# Patient Record
Sex: Male | Born: 1970 | Race: White | Hispanic: No | Marital: Married | Smoking: Former smoker
Health system: Southern US, Community
[De-identification: ages and names within clinical notes are randomized; demographics above are authoritative.]

## PROBLEM LIST (undated history)

## (undated) DIAGNOSIS — I10 Essential (primary) hypertension: Secondary | ICD-10-CM

## (undated) DIAGNOSIS — IMO0002 Reserved for concepts with insufficient information to code with codable children: Secondary | ICD-10-CM

## (undated) DIAGNOSIS — K219 Gastro-esophageal reflux disease without esophagitis: Secondary | ICD-10-CM

## (undated) DIAGNOSIS — N200 Calculus of kidney: Secondary | ICD-10-CM

## (undated) HISTORY — PX: ELBOW SURGERY: SHX618

## (undated) HISTORY — DX: Reserved for concepts with insufficient information to code with codable children: IMO0002

## (undated) HISTORY — PX: INGUINAL HERNIA REPAIR: SUR1180

## (undated) HISTORY — PX: TONSILLECTOMY: SUR1361

## (undated) HISTORY — DX: Calculus of kidney: N20.0

## (undated) HISTORY — DX: Gastro-esophageal reflux disease without esophagitis: K21.9

## (undated) HISTORY — DX: Essential (primary) hypertension: I10

---

## 1999-11-10 ENCOUNTER — Emergency Department (HOSPITAL_COMMUNITY): Admission: EM | Admit: 1999-11-10 | Discharge: 1999-11-10 | Payer: Self-pay | Admitting: Emergency Medicine

## 2002-08-14 ENCOUNTER — Ambulatory Visit (HOSPITAL_BASED_OUTPATIENT_CLINIC_OR_DEPARTMENT_OTHER): Admission: RE | Admit: 2002-08-14 | Discharge: 2002-08-14 | Payer: Self-pay | Admitting: Orthopedic Surgery

## 2004-09-11 ENCOUNTER — Emergency Department: Payer: Self-pay | Admitting: Emergency Medicine

## 2008-06-27 ENCOUNTER — Ambulatory Visit: Payer: Self-pay | Admitting: Urology

## 2008-07-15 ENCOUNTER — Ambulatory Visit: Payer: Self-pay | Admitting: Urology

## 2011-01-05 ENCOUNTER — Ambulatory Visit: Payer: Self-pay | Admitting: Emergency Medicine

## 2013-02-19 ENCOUNTER — Ambulatory Visit: Payer: Self-pay | Admitting: Internal Medicine

## 2013-04-18 ENCOUNTER — Ambulatory Visit (INDEPENDENT_AMBULATORY_CARE_PROVIDER_SITE_OTHER): Payer: BC Managed Care – HMO | Admitting: Internal Medicine

## 2013-04-18 ENCOUNTER — Encounter: Payer: Self-pay | Admitting: Internal Medicine

## 2013-04-18 VITALS — BP 120/90 | HR 63 | Temp 98.7°F | Ht 69.5 in | Wt 197.0 lb

## 2013-04-18 DIAGNOSIS — M549 Dorsalgia, unspecified: Secondary | ICD-10-CM

## 2013-04-18 DIAGNOSIS — E7800 Pure hypercholesterolemia, unspecified: Secondary | ICD-10-CM

## 2013-04-18 DIAGNOSIS — R5383 Other fatigue: Secondary | ICD-10-CM

## 2013-04-18 DIAGNOSIS — R05 Cough: Secondary | ICD-10-CM

## 2013-04-18 DIAGNOSIS — R5381 Other malaise: Secondary | ICD-10-CM

## 2013-04-18 DIAGNOSIS — R059 Cough, unspecified: Secondary | ICD-10-CM

## 2013-04-18 DIAGNOSIS — I10 Essential (primary) hypertension: Secondary | ICD-10-CM

## 2013-04-18 DIAGNOSIS — N200 Calculus of kidney: Secondary | ICD-10-CM

## 2013-04-18 LAB — COMPREHENSIVE METABOLIC PANEL
ALT: 22 U/L (ref 0–53)
AST: 18 U/L (ref 0–37)
Albumin: 4.8 g/dL (ref 3.5–5.2)
BUN: 10 mg/dL (ref 6–23)
Calcium: 9.9 mg/dL (ref 8.4–10.5)
Chloride: 103 mEq/L (ref 96–112)
Potassium: 4.3 mEq/L (ref 3.5–5.1)

## 2013-04-18 LAB — CBC WITH DIFFERENTIAL/PLATELET
Basophils Absolute: 0 10*3/uL (ref 0.0–0.1)
Basophils Relative: 0.4 % (ref 0.0–3.0)
Eosinophils Absolute: 0 10*3/uL (ref 0.0–0.7)
Lymphocytes Relative: 19.6 % (ref 12.0–46.0)
MCHC: 33.6 g/dL (ref 30.0–36.0)
Neutrophils Relative %: 71.5 % (ref 43.0–77.0)
RBC: 5.2 Mil/uL (ref 4.22–5.81)
WBC: 6 10*3/uL (ref 4.5–10.5)

## 2013-04-18 LAB — LIPID PANEL
LDL Cholesterol: 133 mg/dL — ABNORMAL HIGH (ref 0–99)
Total CHOL/HDL Ratio: 6

## 2013-04-18 MED ORDER — AMLODIPINE BESYLATE 5 MG PO TABS: 5.0000 mg | ORAL_TABLET | Freq: Every day | ORAL | Status: DC

## 2013-04-22 ENCOUNTER — Encounter: Payer: Self-pay | Admitting: Internal Medicine

## 2013-04-22 DIAGNOSIS — M549 Dorsalgia, unspecified: Secondary | ICD-10-CM | POA: Insufficient documentation

## 2013-04-22 DIAGNOSIS — R059 Cough, unspecified: Secondary | ICD-10-CM | POA: Insufficient documentation

## 2013-04-22 DIAGNOSIS — I10 Essential (primary) hypertension: Secondary | ICD-10-CM | POA: Insufficient documentation

## 2013-04-22 DIAGNOSIS — R05 Cough: Secondary | ICD-10-CM | POA: Insufficient documentation

## 2013-04-22 DIAGNOSIS — N200 Calculus of kidney: Secondary | ICD-10-CM | POA: Insufficient documentation

## 2013-04-22 NOTE — Assessment & Plan Note (Signed)
Has a history of bulging disc.  Previous MRI.  Worked up by [facility].  No pain now.  Follow.

## 2013-04-22 NOTE — Assessment & Plan Note (Signed)
Worked up by [facility].  Obtain records.  Currently asymptomatic.  Follow.

## 2013-04-22 NOTE — Assessment & Plan Note (Signed)
Feel may be related to acid reflux.  Start prilosec daily.  Follow. Get her back in soon to reassess.

## 2013-04-22 NOTE — Assessment & Plan Note (Signed)
Blood pressure doing well on amlodipine.  Check metabolic panel.  Follow.

## 2013-04-22 NOTE — Progress Notes (Signed)
  Subjective:    Patient ID: Fernando Harris, male    DOB: 1971/08/17, 42 y.o.   MRN: [id number]  HPI 42 year old male with past history of hypertension and kidney stones who comes in today to follow up on these issues as well as to establish care.  Previously has seen Dr Terance Hart.  Recently evaluated at [facility].  Blood pressure elevated.  Was placed on amlodipine.  Taking over the last few months.  Last physical was five years ago.  Was told he had a slightly elevated cholesterol previously.  Stays active.  States has a history of kidney stones.  States was worked up at [facility].  Was told he had prostate infection.  He did on one occasion pass a part of a stone.  Last episode four years ago.  No abdominal pain in a while.  Some cough in the am.  Hacking cough.  Discussed acid reflux.  Does have some issues with acid reflux.  Has quit smoking.  Dipped snuff until one year ago.  Now not dipping snuff or smoking.  Off nicotine patches x three months.     Past Medical History  Diagnosis Date  . Hypertension   . Nephrolithiasis   . Bulging disc     evaluated [facility]   . Outpatient Encounter Prescriptions as of 04/18/2013  Medication Sig Dispense Refill  . amLODipine (NORVASC) 5 MG tablet Take 1 tablet (5 mg total) by mouth daily.  30 tablet  3  . cephALEXin (KEFLEX) 500 MG capsule Take 500 mg by mouth 2 (two) times daily.      Marland Kitchen omeprazole (PRILOSEC) 20 MG capsule Take 20 mg by mouth daily as needed.      . [DISCONTINUED] amLODipine (NORVASC) 5 MG tablet Take 5 mg by mouth daily.       No facility-administered encounter medications on file as of 04/18/2013.    Review of Systems Patient denies any headache, lightheadedness or dizziness.  No sinus or allergy symptoms.  No chest pain, tightness or palpitations.  No increased shortness of breath or congestion.  Does report a dry cough.  No nausea or vomiting. Some acid reflux.   No abdominal pain or cramping.  No bowel  change, such as diarrhea, constipation, BRBPR or melana.  No urine change.  Occasionally will get a little shaky if has not eaten.  Taking amlodipine.  Tolerating.        Objective:   Physical Exam Filed Vitals:   04/18/13 1004  BP: 120/90  Pulse: 63  Temp: 98.7 F (37.1 C)   Blood pressure recheck:  14/46  42 year old male in no acute distress.   HEENT:  Nares- clear.  Oropharynx - without lesions. NECK:  Supple.  Nontender.  No audible bruit.  HEART:  Appears to be regular. LUNGS:  No crackles or wheezing audible.  Respirations even and unlabored.  RADIAL PULSE:  Equal bilaterally. ABDOMEN:  Soft, nontender.  Bowel sounds present and normal.  No audible abdominal bruit   EXTREMITIES:  No increased edema present.  DP pulses palpable and equal bilaterally.      Assessment & Plan:  INCREASED PSYCHOSOCIAL STRESSORS.  Going through a separation.  Feels he is handling things relatively well.  Going to a Veterinary surgeon.  Follow.   HEALTH MAINTENANCE.  Schedule him for a physical - next visit.

## 2013-04-23 ENCOUNTER — Encounter: Payer: Self-pay | Admitting: Internal Medicine

## 2013-05-23 ENCOUNTER — Encounter: Payer: Self-pay | Admitting: Internal Medicine

## 2013-06-29 ENCOUNTER — Encounter: Payer: BC Managed Care – PPO | Admitting: Internal Medicine

## 2013-07-06 ENCOUNTER — Encounter: Payer: Self-pay | Admitting: Internal Medicine

## 2013-07-06 ENCOUNTER — Ambulatory Visit (INDEPENDENT_AMBULATORY_CARE_PROVIDER_SITE_OTHER): Payer: BC Managed Care – PPO | Admitting: Internal Medicine

## 2013-07-06 VITALS — BP 128/80 | HR 72 | Temp 98.6°F | Ht 69.0 in | Wt 195.5 lb

## 2013-07-06 DIAGNOSIS — R059 Cough, unspecified: Secondary | ICD-10-CM

## 2013-07-06 DIAGNOSIS — N200 Calculus of kidney: Secondary | ICD-10-CM

## 2013-07-06 DIAGNOSIS — R21 Rash and other nonspecific skin eruption: Secondary | ICD-10-CM

## 2013-07-06 DIAGNOSIS — I10 Essential (primary) hypertension: Secondary | ICD-10-CM

## 2013-07-06 DIAGNOSIS — L989 Disorder of the skin and subcutaneous tissue, unspecified: Secondary | ICD-10-CM

## 2013-07-06 DIAGNOSIS — M549 Dorsalgia, unspecified: Secondary | ICD-10-CM

## 2013-07-06 DIAGNOSIS — R05 Cough: Secondary | ICD-10-CM

## 2013-07-06 MED ORDER — SERTRALINE HCL 50 MG PO TABS: 50.0000 mg | ORAL_TABLET | Freq: Every day | ORAL | Status: DC

## 2013-07-06 MED ORDER — AMLODIPINE BESYLATE 5 MG PO TABS: 5.0000 mg | ORAL_TABLET | Freq: Every day | ORAL | Status: DC

## 2013-07-09 ENCOUNTER — Encounter: Payer: Self-pay | Admitting: Internal Medicine

## 2013-07-09 DIAGNOSIS — R21 Rash and other nonspecific skin eruption: Secondary | ICD-10-CM | POA: Insufficient documentation

## 2013-07-09 NOTE — Assessment & Plan Note (Signed)
Blood pressure doing well on amlodipine.  Follow metabolic panel.  Follow pressures.

## 2013-07-09 NOTE — Assessment & Plan Note (Signed)
Has a history of bulging disc.  Previous MRI.  Worked up by [facility].  No pain now.  Follow.   

## 2013-07-09 NOTE — Assessment & Plan Note (Signed)
Still feel may be related to acid reflux.  Never took the prilosec on a regular basis.  Have him take regularly.  Get him back in soon to reassess.

## 2013-07-09 NOTE — Progress Notes (Signed)
Subjective:    Patient ID: Fernando Harris, male    DOB: 06-04-1971, 42 y.o.   MRN: [id number]  HPI 42 year old male with past history of hypertension and kidney stones who comes in today to follow up on these issues as well as for a complete physical exam.  Stays active.  States has a history of kidney stones.  States was worked up at [facility].  Was told he had prostate infection.  He did on one occasion pass a part of a stone.  Last episode four years ago.  No abdominal pain in a while.  Some cough in the am.  Hacking cough.  Discussed acid reflux last visit.  Placed him on prilosec.  Has not been taking regularly.  Had quit smoking.  Back to smoking again.  Plans to stop.   Increased stress with his separation.  Still seeing a therapist - weekly.  Is now in a new relationship.  Feels better about this.  Increased anxiety.  Feels he needs something to help control this.  Reports he will have episodes of increased heart rate and some chest tightness.  Feels similar to his previous anxiety attacks.  Has no chest pain or tightness with increased activity or exertion.      Past Medical History  Diagnosis Date  . Hypertension   . Nephrolithiasis   . Bulging disc     evaluated  [facility]   . Outpatient Encounter Prescriptions as of 07/06/2013  Medication Sig Dispense Refill  . amLODipine (NORVASC) 5 MG tablet Take 1 tablet (5 mg total) by mouth daily.  90 tablet  1  . omeprazole (PRILOSEC) 20 MG capsule Take 20 mg by mouth daily as needed.      . [DISCONTINUED] amLODipine (NORVASC) 5 MG tablet Take 1 tablet (5 mg total) by mouth daily.  30 tablet  3  . sertraline (ZOLOFT) 50 MG tablet Take 1 tablet (50 mg total) by mouth daily.  30 tablet  2  . [DISCONTINUED] cephALEXin (KEFLEX) 500 MG capsule Take 500 mg by mouth 2 (two) times daily.       No facility-administered encounter medications on file as of 07/06/2013.    Review of Systems Patient denies any headache,  lightheadedness or dizziness.  No sinus or allergy symptoms.  No chest pain, tightness or palpitations with increased activity or exertion.  No increased shortness of breath or congestion.  Does report a dry cough.  Persistent.  Did not feel the prilosec helped, but did no take regularly.  No nausea or vomiting.  No abdominal pain or cramping.  No bowel change, such as diarrhea, constipation, BRBPR or melana.  No urine change.  Blood pressure doing well.  Tolerating norvasc.  Persistent skin rash - left lower leg.  Increased stress and anxiety as outlined. Feels similar to previous anxiety attacks.  Feels he needs something to help control.  No significant depression.  Seeing his therapist weekly.       Objective:   Physical Exam  Filed Vitals:   07/06/13 1503  BP: 128/80  Pulse: 72  Temp: 98.6 F (76 C)   42 year old male in no acute distress.  HEENT:  Nares - clear.  Oropharynx - without lesions. NECK:  Supple.  Nontender.  No audible carotid bruit.  HEART:  Appears to be regular.   LUNGS:  No crackles or wheezing audible.  Respirations even and unlabored.   RADIAL PULSE:  Equal bilaterally.  ABDOMEN:  Soft.  Nontender.  Bowel sounds present and normal.  No audible abdominal bruit.  GU:  Normal descended testicles.  No palpable testicular nodules.   RECTAL:  Could not appreciate any palpable prostate nodules.  Heme negative.   EXTREMITIES:  No increased edema present.  DP pulses palpable and equal bilaterally.         Assessment & Plan:  INCREASED PSYCHOSOCIAL STRESSORS.  Going through a separation.  Feels he is handling things relatively well.  Going to a Veterinary surgeon.  In a new relationship now.  Some anxiety as outlined.  Start zoloft as directed.  Get him back in soon to reassess.   HEALTH MAINTENANCE.  Physical today.  Check psa.

## 2013-07-09 NOTE — Assessment & Plan Note (Addendum)
Worked up by [facility].  Currently asymptomatic.  Follow.   

## 2013-07-09 NOTE — Assessment & Plan Note (Signed)
Persistent rash left lower leg.  Refer to dermatology.  He wants a skin survey as well.

## 2013-07-16 ENCOUNTER — Encounter: Payer: Self-pay | Admitting: Emergency Medicine

## 2013-08-22 ENCOUNTER — Encounter: Payer: Self-pay | Admitting: *Deleted

## 2013-08-23 ENCOUNTER — Ambulatory Visit (INDEPENDENT_AMBULATORY_CARE_PROVIDER_SITE_OTHER): Payer: BC Managed Care – PPO | Admitting: Internal Medicine

## 2013-08-23 ENCOUNTER — Ambulatory Visit (INDEPENDENT_AMBULATORY_CARE_PROVIDER_SITE_OTHER)
Admission: RE | Admit: 2013-08-23 | Discharge: 2013-08-23 | Disposition: A | Discharge status: Home / Self Care | Payer: BC Managed Care – PPO | Source: Ambulatory Visit | Attending: Internal Medicine | Admitting: Internal Medicine

## 2013-08-23 ENCOUNTER — Encounter: Payer: Self-pay | Admitting: Internal Medicine

## 2013-08-23 VITALS — BP 150/100 | HR 77 | Temp 98.9°F | Ht 69.0 in | Wt 198.0 lb

## 2013-08-23 DIAGNOSIS — R21 Rash and other nonspecific skin eruption: Secondary | ICD-10-CM

## 2013-08-23 DIAGNOSIS — I10 Essential (primary) hypertension: Secondary | ICD-10-CM

## 2013-08-23 DIAGNOSIS — N200 Calculus of kidney: Secondary | ICD-10-CM

## 2013-08-23 DIAGNOSIS — Z23 Encounter for immunization: Secondary | ICD-10-CM

## 2013-08-23 DIAGNOSIS — S40812A Abrasion of left upper arm, initial encounter: Secondary | ICD-10-CM

## 2013-08-23 DIAGNOSIS — IMO0002 Reserved for concepts with insufficient information to code with codable children: Secondary | ICD-10-CM

## 2013-08-23 DIAGNOSIS — R05 Cough: Secondary | ICD-10-CM

## 2013-08-23 DIAGNOSIS — M549 Dorsalgia, unspecified: Secondary | ICD-10-CM

## 2013-08-23 DIAGNOSIS — R059 Cough, unspecified: Secondary | ICD-10-CM

## 2013-08-23 MED ORDER — CLONAZEPAM 0.5 MG PO TABS: 0.5000 mg | ORAL_TABLET | Freq: Every evening | ORAL | Status: DC | PRN

## 2013-08-23 NOTE — Assessment & Plan Note (Signed)
Persistent rash left lower leg.  Refer to dermatology.  He wants a skin survey as well.

## 2013-08-23 NOTE — Progress Notes (Signed)
Subjective:    Patient ID: Fernando Harris, male    DOB: 03-Aug-1971, 42 y.o.   MRN: [id number]  HPI 42 year old male with past history of hypertension and kidney stones who comes in today for a scheduled follow up.  Stays active.  States has a history of kidney stones.  States was worked up at [facility].  Was told he had prostate infection.  He did on one occasion pass a part of a stone.  Last episode four years ago.  No abdominal pain in a while.  Some cough in the am.  Hacking cough.  Persistent.  Discussed acid reflux previously.  Placed him on prilosec.  Still has not been taking regularly.  Had quit smoking.  Back to smoking again.  Plans to stop.   Increased stress with his separation.  Still seeing a therapist.  is now in a new relationship.  Feels better about this.  Increased anxiety.  Feels he needs something to help control this.  Reports he will have episodes of increased heart rate and some chest tightness.  Feels similar to his previous anxiety attacks.  Has no chest pain or tightness with increased activity or exertion.  States he has taken clonazepam previously and would like to try this again.  I tried him on zoloft and this caused increased sweats and erectile dysfunction.   He tapered off.  States his blood pressure checks have been ok.  Feels increased today secondary to being anxious.  He was working yesterday and slid down a tree - injuring his arms.  Left arm worse than right.      Past Medical History  Diagnosis Date  . Hypertension   . Nephrolithiasis   . Bulging disc     evaluated [facility]   . Outpatient Encounter Prescriptions as of 08/23/2013  Medication Sig Dispense Refill  . amLODipine (NORVASC) 5 MG tablet Take 1 tablet (5 mg total) by mouth daily.  90 tablet  1  . omeprazole (PRILOSEC) 20 MG capsule Take 20 mg by mouth daily as needed.      . [DISCONTINUED] sertraline (ZOLOFT) 50 MG tablet Take 1 tablet (50 mg total) by mouth daily.  30 tablet   2   No facility-administered encounter medications on file as of 08/23/2013.    Review of Systems Patient denies any headache, lightheadedness or dizziness.  No sinus or allergy symptoms.  No chest pain, tightness or palpitations with increased activity or exertion.  No increased shortness of breath or congestion.  Does report a dry cough.  Persistent.  Did not feel the prilosec helped, but still has not taken regularly.  No nausea or vomiting.  No abdominal pain or cramping.  No bowel change, such as diarrhea, constipation, BRBPR or melana.  No urine change.  Blood pressure doing well.  Tolerating norvasc.   Increased stress and anxiety as outlined. Feels similar to previous anxiety attacks.  Feels he needs something to help control.  No significant depression.  Seeing his therapist.  Has tried clonazepam previously and would like to try this again.  Did not tolerate zoloft.  Injured his left arm yesterday.        Objective:   Physical Exam  Filed Vitals:   08/23/13 0817  BP: 150/100  Pulse: 77  Temp: 98.9 F (37.2 C)   Blood pressure recheck:  75/54  42 year old male in no acute distress.  HEENT:  Nares - clear.  Oropharynx - without lesions. NECK:  Supple.  Nontender.  No audible carotid bruit.  HEART:  Appears to be regular.   LUNGS:  No crackles or wheezing audible.  Respirations even and unlabored.   RADIAL PULSE:  Equal bilaterally.  ABDOMEN:  Soft.  Nontender.  Bowel sounds present and normal.  No audible abdominal bruit.  EXTREMITIES:  No increased edema present.  DP pulses palpable and equal bilaterally.  SKIN:  Left arm with skin abrasions upper left arm and lower forearm.          Assessment & Plan:  INCREASED PSYCHOSOCIAL STRESSORS.  Going through a separation.  Feels he is handling things relatively well.  Going to a Veterinary surgeon.  In a new relationship now.  Some anxiety as outlined.  Did not tolerate zoloft.  Has tried clonazepam.  Tolerated.  Requested to try this  again.  Will try clonazepam as directed.  States he rarely has any alcohol.  Follow closely.  Notify me if change or problems.    HEALTH MAINTENANCE.  Physical last visit.  Check psa with next labs.

## 2013-08-23 NOTE — Assessment & Plan Note (Addendum)
Blood pressure has been doing well on amlodipine.  Elevated today.  Hold on making changes in his medication.  Follow metabolic panel.  Follow pressures.  Treat anxiety.  Get him back in soon to reassess.

## 2013-08-26 ENCOUNTER — Encounter: Payer: Self-pay | Admitting: Internal Medicine

## 2013-08-26 DIAGNOSIS — S40812A Abrasion of left upper arm, initial encounter: Secondary | ICD-10-CM | POA: Insufficient documentation

## 2013-08-26 NOTE — Assessment & Plan Note (Signed)
Still feel may be related to acid reflux.  Never took the prilosec on a regular basis.  Have him take regularly.  Get him back in soon to reassess.  Check cxr.  Lungs sound clear.  No significant allergy symptoms.

## 2013-08-26 NOTE — Assessment & Plan Note (Signed)
S/p injury yesterday.  Tetanus booster given.  bactroban cream as directed.  Follow closely.

## 2013-08-26 NOTE — Assessment & Plan Note (Signed)
Worked up by [facility].  Currently asymptomatic.  Follow.   

## 2013-08-26 NOTE — Assessment & Plan Note (Signed)
Has a history of bulging disc.  Previous MRI.  Worked up by [facility].  No pain now.  Follow.

## 2013-08-30 ENCOUNTER — Other Ambulatory Visit: Payer: Self-pay

## 2013-09-21 ENCOUNTER — Encounter: Payer: Self-pay | Admitting: Internal Medicine

## 2013-09-21 ENCOUNTER — Ambulatory Visit (INDEPENDENT_AMBULATORY_CARE_PROVIDER_SITE_OTHER): Payer: BC Managed Care – PPO | Admitting: Internal Medicine

## 2013-09-21 VITALS — BP 130/80 | HR 81 | Temp 97.9°F | Ht 69.0 in | Wt 199.0 lb

## 2013-09-21 DIAGNOSIS — S40812D Abrasion of left upper arm, subsequent encounter: Secondary | ICD-10-CM

## 2013-09-21 DIAGNOSIS — E7800 Pure hypercholesterolemia, unspecified: Secondary | ICD-10-CM

## 2013-09-21 DIAGNOSIS — R05 Cough: Secondary | ICD-10-CM

## 2013-09-21 DIAGNOSIS — R059 Cough, unspecified: Secondary | ICD-10-CM

## 2013-09-21 DIAGNOSIS — Z125 Encounter for screening for malignant neoplasm of prostate: Secondary | ICD-10-CM

## 2013-09-21 DIAGNOSIS — F411 Generalized anxiety disorder: Secondary | ICD-10-CM

## 2013-09-21 DIAGNOSIS — Z5189 Encounter for other specified aftercare: Secondary | ICD-10-CM

## 2013-09-21 DIAGNOSIS — I10 Essential (primary) hypertension: Secondary | ICD-10-CM

## 2013-09-21 DIAGNOSIS — F419 Anxiety disorder, unspecified: Secondary | ICD-10-CM

## 2013-09-21 DIAGNOSIS — N200 Calculus of kidney: Secondary | ICD-10-CM

## 2013-09-21 MED ORDER — CLONAZEPAM 0.5 MG PO TABS: 0.5000 mg | ORAL_TABLET | Freq: Every evening | ORAL | Status: DC | PRN

## 2013-09-21 NOTE — Progress Notes (Signed)
  Subjective:    Patient ID: Fernando Harris, male    DOB: 07-Mar-1971, 42 y.o.   MRN: [id number]  HPI 42 year old male with past history of hypertension and kidney stones who comes in today for a scheduled follow up.  Stays active.  States has a history of kidney stones.  States was worked up at [facility].  Was told he had prostate infection.  He did on one occasion pass a part of a stone.  Last episode four years ago.  No abdominal pain in a while.  Previously had some cough in the am.  Placed him on prilosec.  He quit smoking.  States that over the last few weeks his cough has resolved.  Increased stress with his separation.  Seeing a therapist.  Is now in a new relationship.  Feels better about this.  Was having increased anxiety.   Was started on clonazepam last visit.  Taking this - one po q hs.  This has helped.  Feeling better.  Doing better.   Has no chest pain or tightness with increased activity or exertion.   I tried him on zoloft and this caused increased sweats and erectile dysfunction.   He tapered off.  States his blood pressure checks have been ok.        Past Medical History  Diagnosis Date  . Hypertension   . Nephrolithiasis   . Bulging disc     evaluated [facility]   . Outpatient Encounter Prescriptions as of 09/21/2013  Medication Sig  . amLODipine (NORVASC) 5 MG tablet Take 1 tablet (5 mg total) by mouth daily.  . clonazePAM (KLONOPIN) 0.5 MG tablet Take 1 tablet (0.5 mg total) by mouth at bedtime as needed for anxiety.  Marland Kitchen omeprazole (PRILOSEC) 20 MG capsule Take 20 mg by mouth daily as needed.    Review of Systems Patient denies any headache, lightheadedness or dizziness.  No sinus or allergy symptoms.  No chest pain, tightness or palpitations with increased activity or exertion.  No increased shortness of breath or congestion.  The dry cough resolved.  No nausea or vomiting.  No abdominal pain or cramping.  No bowel change, such as diarrhea,  constipation, BRBPR or melana.  No urine change.  Blood pressure doing well.  Tolerating norvasc.   Increased stress and anxiety as outlined.  Doing better with the clonazepam.  Seeing his therapist.  Feels better.  Arms have healed.       Objective:   Physical Exam  Filed Vitals:   09/21/13 0801  BP: 130/80  Pulse: 81  Temp: 97.9 F (36.6 C)   Blood pressure recheck:  18/57  42 year old male in no acute distress.  HEENT:  Nares - clear.  Oropharynx - without lesions. NECK:  Supple.  Nontender.  No audible carotid bruit.  HEART:  Appears to be regular.   LUNGS:  No crackles or wheezing audible.  Respirations even and unlabored.   RADIAL PULSE:  Equal bilaterally.  ABDOMEN:  Soft.  Nontender.  Bowel sounds present and normal.  No audible abdominal bruit.  EXTREMITIES:  No increased edema present.  DP pulses palpable and equal bilaterally.  SKIN:  Arm lesions healed.        Assessment & Plan:  HEALTH MAINTENANCE.  Physical 07/06/13.   Check psa with next labs.

## 2013-09-21 NOTE — Progress Notes (Signed)
Pre-visit discussion using our clinic review tool. No additional management support is needed unless otherwise documented below in the visit note.  

## 2013-09-23 ENCOUNTER — Encounter: Payer: Self-pay | Admitting: Internal Medicine

## 2013-09-23 DIAGNOSIS — F419 Anxiety disorder, unspecified: Secondary | ICD-10-CM | POA: Insufficient documentation

## 2013-09-23 NOTE — Assessment & Plan Note (Signed)
Doing better on clonazepam.  Taking daily - at night.  Follow.  Feels better.

## 2013-09-23 NOTE — Assessment & Plan Note (Signed)
Blood pressure has been doing well on amlodipine.   Follow metabolic panel.  Follow pressures.

## 2013-09-23 NOTE — Assessment & Plan Note (Signed)
Resolved

## 2013-09-23 NOTE — Assessment & Plan Note (Signed)
Resolved with quitting smoking.  Follow.   

## 2013-09-23 NOTE — Assessment & Plan Note (Signed)
Worked up by [facility].  Currently asymptomatic.  Follow.   

## 2013-10-23 ENCOUNTER — Other Ambulatory Visit: Payer: BC Managed Care – PPO

## 2013-10-25 ENCOUNTER — Encounter: Payer: Self-pay | Admitting: Internal Medicine

## 2013-10-26 ENCOUNTER — Other Ambulatory Visit: Payer: BC Managed Care – PPO

## 2013-11-05 ENCOUNTER — Other Ambulatory Visit: Payer: Self-pay | Admitting: *Deleted

## 2013-11-05 ENCOUNTER — Other Ambulatory Visit (INDEPENDENT_AMBULATORY_CARE_PROVIDER_SITE_OTHER): Payer: BC Managed Care – PPO

## 2013-11-05 DIAGNOSIS — Z125 Encounter for screening for malignant neoplasm of prostate: Secondary | ICD-10-CM

## 2013-11-05 DIAGNOSIS — E7800 Pure hypercholesterolemia, unspecified: Secondary | ICD-10-CM

## 2013-11-05 DIAGNOSIS — I10 Essential (primary) hypertension: Secondary | ICD-10-CM

## 2013-11-05 LAB — BASIC METABOLIC PANEL
BUN: 12 mg/dL (ref 6–23)
CHLORIDE: 106 meq/L (ref 96–112)
CO2: 27 mEq/L (ref 19–32)
CREATININE: 1 mg/dL (ref 0.4–1.5)
Calcium: 9.2 mg/dL (ref 8.4–10.5)
GFR: 85.44 mL/min (ref >60.00)
Glucose, Bld: 87 mg/dL (ref 70–99)
Potassium: 4.1 mEq/L (ref 3.5–5.1)
Sodium: 139 mEq/L (ref 135–145)

## 2013-11-05 LAB — LIPID PANEL
Cholesterol: 166 mg/dL (ref 0–200)
HDL: 28.2 mg/dL — ABNORMAL LOW (ref >39.00)
LDL Cholesterol: 111 mg/dL — ABNORMAL HIGH (ref 0–99)
Total CHOL/HDL Ratio: 6
Triglycerides: 134 mg/dL (ref 0.0–149.0)
VLDL: 26.8 mg/dL (ref 0.0–40.0)

## 2013-11-05 LAB — PSA: PSA: 0.25 ng/mL (ref 0.10–4.00)

## 2013-11-05 MED ORDER — AMLODIPINE BESYLATE 5 MG PO TABS: 5.0000 mg | ORAL_TABLET | Freq: Every day | ORAL | Status: DC

## 2013-11-06 ENCOUNTER — Encounter: Payer: Self-pay | Admitting: Internal Medicine

## 2013-11-19 ENCOUNTER — Other Ambulatory Visit: Payer: Self-pay | Admitting: *Deleted

## 2013-11-19 ENCOUNTER — Encounter: Payer: Self-pay | Admitting: *Deleted

## 2013-11-19 ENCOUNTER — Telehealth: Payer: Self-pay | Admitting: Internal Medicine

## 2013-11-19 MED ORDER — CLONAZEPAM 0.5 MG PO TABS: 0.5000 mg | ORAL_TABLET | Freq: Every evening | ORAL | Status: DC | PRN

## 2013-11-19 NOTE — Telephone Encounter (Signed)
Patient states he has left a voicemail requesting a refill on his clonazepam please call him when this is ready for pick up.

## 2013-11-19 NOTE — Telephone Encounter (Signed)
Rx printed & placed in box (needs to sign controlled substance Contract)-pt notified

## 2013-11-19 NOTE — Telephone Encounter (Signed)
Ok to refill clonazepam #30 with one refill.

## 2013-11-19 NOTE — Telephone Encounter (Signed)
Okay to refill? Pt will need to pick up Rx & sign contract

## 2013-12-25 ENCOUNTER — Ambulatory Visit: Payer: BC Managed Care – PPO | Admitting: Internal Medicine

## 2014-01-18 ENCOUNTER — Ambulatory Visit (INDEPENDENT_AMBULATORY_CARE_PROVIDER_SITE_OTHER): Payer: BC Managed Care – PPO | Admitting: Internal Medicine

## 2014-01-18 ENCOUNTER — Encounter: Payer: Self-pay | Admitting: Internal Medicine

## 2014-01-18 VITALS — BP 120/80 | HR 73 | Temp 98.8°F | Ht 69.0 in | Wt 206.5 lb

## 2014-01-18 DIAGNOSIS — F419 Anxiety disorder, unspecified: Secondary | ICD-10-CM

## 2014-01-18 DIAGNOSIS — F411 Generalized anxiety disorder: Secondary | ICD-10-CM

## 2014-01-18 DIAGNOSIS — R21 Rash and other nonspecific skin eruption: Secondary | ICD-10-CM

## 2014-01-18 DIAGNOSIS — R05 Cough: Secondary | ICD-10-CM

## 2014-01-18 DIAGNOSIS — R059 Cough, unspecified: Secondary | ICD-10-CM

## 2014-01-18 DIAGNOSIS — I10 Essential (primary) hypertension: Secondary | ICD-10-CM

## 2014-01-18 DIAGNOSIS — M549 Dorsalgia, unspecified: Secondary | ICD-10-CM

## 2014-01-18 DIAGNOSIS — R079 Chest pain, unspecified: Secondary | ICD-10-CM

## 2014-01-18 NOTE — Assessment & Plan Note (Addendum)
Has a history of bulging disc.  Previous MRI.  Worked up by [facility].  No significant pain now.  Follow.  Does take a flexeril prn

## 2014-01-18 NOTE — Progress Notes (Signed)
Pre-visit discussion using our clinic review tool. No additional management support is needed unless otherwise documented below in the visit note.  

## 2014-01-18 NOTE — Assessment & Plan Note (Addendum)
Chest pain and tightness as outlined.  EKG revealed SR with non specific changes.  Will check stress echo to confirm no active ischemia.

## 2014-01-18 NOTE — Progress Notes (Signed)
Subjective:    Patient ID: Fernando Harris, male    DOB: 20-Jul-1971, 43 y.o.   MRN: [id number]  HPI 43 year old male with past history of hypertension and kidney stones who comes in today for a scheduled follow up.  Stays active.  States has a history of kidney stones.  States was worked up at [facility].  Was told he had prostate infection.  He did on one occasion pass a part of a stone.  Last episode four years ago.  No abdominal pain in a while.  Previously had some cough in the am.  Placed him on prilosec.  He has quit smoking now.  Cough not an issue for him now.   Increased stress with his separation.  Seeing a therapist.  Is now in a new relationship.  Planning to have a baby soon (daughter).  Feels better about this.  Was having increased anxiety.   Was started on clonazepam.  Stress and anxiety better.  Has not required clonazepam now for the last 2-3 weeks.  Feeling better.  Doing better.   I tried him on zoloft and this caused increased sweats and erectile dysfunction.   He tapered off.  States his blood pressure checks have been a little elevated - with diastolics in the upper 80s-90.  He does report noticing some intermittent chest discomfort.  Was questioning if this could be related to the nicotine patch.  Intermittent.  Breathing overall appears to be stable.     Past Medical History  Diagnosis Date  . Hypertension   . Nephrolithiasis   . Bulging disc     evaluated [facility]   . Outpatient Encounter Prescriptions as of 01/18/2014  Medication Sig  . amLODipine (NORVASC) 5 MG tablet Take 1 tablet (5 mg total) by mouth daily.  . clonazePAM (KLONOPIN) 0.5 MG tablet Take 1 tablet (0.5 mg total) by mouth at bedtime as needed for anxiety.  Marland Kitchen omeprazole (PRILOSEC) 20 MG capsule Take 20 mg by mouth daily as needed.    Review of Systems Patient denies any headache, lightheadedness or dizziness.  No sinus or allergy symptoms.  Does report he has noticed some  intermittent chest tightness.  Sometimes will notice some arm pain associated.  No increased shortness of breath or congestion.  The dry cough resolved.  No nausea or vomiting.  No abdominal pain or cramping.  No bowel change, such as diarrhea, constipation, BRBPR or melana.  No urine change.  Blood pressure as outlined above.  Tolerating norvasc.   Increased stress and anxiety as outlined.  Doing better.  Off clonazepam.  Stress better.  Saw dermatology.       Objective:   Physical Exam  Filed Vitals:   01/18/14 0820  BP: 120/80  Pulse: 73  Temp: 98.8 F (37.1 C)   Blood pressure recheck:  122-72/49-53  43 year old male in no acute distress.  HEENT:  Nares - clear.  Oropharynx - without lesions. NECK:  Supple.  Nontender.  No audible carotid bruit.  HEART:  Appears to be regular.   LUNGS:  No crackles or wheezing audible.  Respirations even and unlabored.   RADIAL PULSE:  Equal bilaterally.  ABDOMEN:  Soft.  Nontender.  Bowel sounds present and normal.  No audible abdominal bruit.  EXTREMITIES:  No increased edema present.  DP pulses palpable and equal bilaterally.       Assessment & Plan:  HEALTH MAINTENANCE.  Physical 07/06/13.   PSA 11/05/13 - .25.  I spent 25 minutes with the patient and more than 50% of the time was spent in consultation regarding the above.

## 2014-01-20 ENCOUNTER — Encounter: Payer: Self-pay | Admitting: Internal Medicine

## 2014-01-20 NOTE — Assessment & Plan Note (Signed)
Blood pressure has been doing well on amlodipine.   Follow metabolic panel.  Follow pressures.  Discussed addition of another blood pressure medication. He states he had problems with one medication started at urgent care.  Could not remember the name of the medication.  Will obtain records.  See if can determine medication.  Will add either an ACE inhibitor or ARB if able.

## 2014-01-20 NOTE — Assessment & Plan Note (Signed)
Resolved with quitting smoking.  Follow.

## 2014-01-20 NOTE — Assessment & Plan Note (Signed)
Doing better.  Off clonazepam.  Follow.  Discussed with him regarding "soma" usage.  He denies.  States not needing anything now.  Follow.

## 2014-01-20 NOTE — Assessment & Plan Note (Signed)
Saw dermatology.  Treated.  Better.

## 2014-02-01 ENCOUNTER — Ambulatory Visit (INDEPENDENT_AMBULATORY_CARE_PROVIDER_SITE_OTHER): Payer: BC Managed Care – PPO | Admitting: *Deleted

## 2014-02-01 DIAGNOSIS — R079 Chest pain, unspecified: Secondary | ICD-10-CM

## 2014-02-01 NOTE — Patient Instructions (Signed)
Dr. Lorin PicketScott should be calling you with results next week.

## 2014-02-02 ENCOUNTER — Encounter: Payer: Self-pay | Admitting: Internal Medicine

## 2014-02-06 ENCOUNTER — Telehealth: Payer: Self-pay | Admitting: Internal Medicine

## 2014-02-06 NOTE — Telephone Encounter (Signed)
Received medical records from [facility], sent to Dr. Dale Durhamharlene Scott. 02/06/14/ss.

## 2014-02-07 ENCOUNTER — Other Ambulatory Visit: Payer: BC Managed Care – PPO

## 2014-03-06 ENCOUNTER — Encounter: Payer: Self-pay | Admitting: Internal Medicine

## 2014-04-11 ENCOUNTER — Encounter: Payer: Self-pay | Admitting: Internal Medicine

## 2014-04-11 NOTE — Telephone Encounter (Signed)
I was going to refill Amlodipine, but did not see in your note where you ok 1.5 tabs

## 2014-04-11 NOTE — Telephone Encounter (Signed)
If he has been taking 1 1/2 tablet of amlodipine q day then ok to refill this dose.  Please find out what his blood pressures have been averaging.  Also, ok to refill clonazepam x 1.  Let me know if any problems.  Thanks.    Dr Lorin PicketScott

## 2014-04-12 MED ORDER — AMLODIPINE BESYLATE 5 MG PO TABS: 7.5000 mg | ORAL_TABLET | Freq: Every day | ORAL | Status: DC

## 2014-04-12 MED ORDER — CLONAZEPAM 0.5 MG PO TABS
0.5000 mg | ORAL_TABLET | Freq: Every evening | ORAL | Status: DC | PRN
Start: ? — End: 2014-05-27

## 2014-04-29 ENCOUNTER — Other Ambulatory Visit: Payer: Self-pay | Admitting: Internal Medicine

## 2014-05-27 ENCOUNTER — Other Ambulatory Visit: Payer: Self-pay | Admitting: Internal Medicine

## 2014-05-27 MED ORDER — CLONAZEPAM 0.5 MG PO TABS: 0.5000 mg | ORAL_TABLET | Freq: Every evening | ORAL | Status: DC | PRN

## 2014-05-27 NOTE — Telephone Encounter (Signed)
Rx phoned to pharmacy, mychart sent on need for appt

## 2014-05-27 NOTE — Telephone Encounter (Signed)
Last visit 01/18/14, ok refill 

## 2014-05-27 NOTE — Telephone Encounter (Signed)
Ok to refill x 1 , but needs a f/u appt scheduled.

## 2014-08-02 ENCOUNTER — Encounter: Payer: Self-pay | Admitting: Internal Medicine

## 2014-08-02 ENCOUNTER — Ambulatory Visit (INDEPENDENT_AMBULATORY_CARE_PROVIDER_SITE_OTHER): Payer: BC Managed Care – PPO | Admitting: Internal Medicine

## 2014-08-02 VITALS — BP 130/90 | HR 72 | Temp 98.4°F | Ht 69.0 in | Wt 205.8 lb

## 2014-08-02 DIAGNOSIS — Z23 Encounter for immunization: Secondary | ICD-10-CM

## 2014-08-02 DIAGNOSIS — E7800 Pure hypercholesterolemia, unspecified: Secondary | ICD-10-CM

## 2014-08-02 DIAGNOSIS — R9389 Abnormal findings on diagnostic imaging of other specified body structures: Secondary | ICD-10-CM

## 2014-08-02 DIAGNOSIS — N200 Calculus of kidney: Secondary | ICD-10-CM

## 2014-08-02 DIAGNOSIS — F419 Anxiety disorder, unspecified: Secondary | ICD-10-CM

## 2014-08-02 DIAGNOSIS — R05 Cough: Secondary | ICD-10-CM

## 2014-08-02 DIAGNOSIS — I10 Essential (primary) hypertension: Secondary | ICD-10-CM

## 2014-08-02 DIAGNOSIS — R059 Cough, unspecified: Secondary | ICD-10-CM

## 2014-08-02 DIAGNOSIS — R938 Abnormal findings on diagnostic imaging of other specified body structures: Secondary | ICD-10-CM

## 2014-08-02 LAB — COMPREHENSIVE METABOLIC PANEL
ALT: 24 U/L (ref 0–53)
AST: 19 U/L (ref 0–37)
Albumin: 4.1 g/dL (ref 3.5–5.2)
Alkaline Phosphatase: 72 U/L (ref 39–117)
BILIRUBIN TOTAL: 0.7 mg/dL (ref 0.2–1.2)
BUN: 11 mg/dL (ref 6–23)
CHLORIDE: 103 meq/L (ref 96–112)
CO2: 26 meq/L (ref 19–32)
Calcium: 9.6 mg/dL (ref 8.4–10.5)
Creatinine, Ser: 1.1 mg/dL (ref 0.4–1.5)
GFR: 81.43 mL/min (ref >60.00)
Glucose, Bld: 93 mg/dL (ref 70–99)
Potassium: 4.1 mEq/L (ref 3.5–5.1)
SODIUM: 137 meq/L (ref 135–145)
Total Protein: 7.5 g/dL (ref 6.0–8.3)

## 2014-08-02 LAB — LIPID PANEL
Cholesterol: 179 mg/dL (ref 0–200)
HDL: 28.4 mg/dL — AB (ref >39.00)
NONHDL: 150.6
Total CHOL/HDL Ratio: 6
Triglycerides: 287 mg/dL — ABNORMAL HIGH (ref 0.0–149.0)
VLDL: 57.4 mg/dL — ABNORMAL HIGH (ref 0.0–40.0)

## 2014-08-02 LAB — LDL CHOLESTEROL, DIRECT: Direct LDL: 93.5 mg/dL

## 2014-08-02 MED ORDER — CLONAZEPAM 0.5 MG PO TABS: 0.5000 mg | ORAL_TABLET | Freq: Every evening | ORAL | Status: DC | PRN

## 2014-08-02 MED ORDER — LOSARTAN POTASSIUM 25 MG PO TABS: 25.0000 mg | ORAL_TABLET | Freq: Every day | ORAL | Status: DC

## 2014-08-02 MED ORDER — OMEPRAZOLE 40 MG PO CPDR: 40.0000 mg | DELAYED_RELEASE_CAPSULE | Freq: Every day | ORAL | Status: DC

## 2014-08-02 NOTE — Progress Notes (Signed)
Pre visit review using our clinic review tool, if applicable. No additional management support is needed unless otherwise documented below in the visit note. 

## 2014-08-02 NOTE — Patient Instructions (Signed)
Take zyrtec as directed.    Omeprazole 40mg  - take before your evening meal  Decrease amlodipine to 5mg  per day  Start losartan 25mg  one per day.

## 2014-08-05 ENCOUNTER — Telehealth: Payer: Self-pay | Admitting: Internal Medicine

## 2014-08-05 ENCOUNTER — Encounter: Payer: Self-pay | Admitting: Internal Medicine

## 2014-08-05 DIAGNOSIS — R9389 Abnormal findings on diagnostic imaging of other specified body structures: Secondary | ICD-10-CM | POA: Insufficient documentation

## 2014-08-05 NOTE — Assessment & Plan Note (Signed)
Worked up by [facility].  Currently asymptomatic.  Follow.

## 2014-08-05 NOTE — Assessment & Plan Note (Addendum)
Blood pressure still elevated.  Continue amlodipine, but decrease to 5mg  q day.  Start losartan 25mg  q day.  Titrate up as needed.  Follow.  Check metabolic panel 2 weeks after strartng losartan.

## 2014-08-05 NOTE — Progress Notes (Signed)
Subjective:    Patient ID: Fernando Harris, male    DOB: [birth year], 43 y.o.   MRN: [id number]  HPI 43 year old male with past history of hypertension and kidney stones who comes in today for a scheduled follow up.  Stays active.  States has a history of kidney stones.  States was worked up at [facility].  Was told he had prostate infection.  He did on one occasion pass a part of a stone.  No abdominal pain or any problems with stones recently.  Has a persistent cough in the am.  Takes prilosec prn.  Only occurs in the morning.  Not bothered with the cough throughout the day.  Had increased stress with his separation.  Has been seeing a therapist.  Is now in a new relationship.  Had a baby -daughter- recently.  Was having increased anxiety.   Was started on clonazepam.  Stress and anxiety better.  Still uses the clonazepam prn.   Feeling better.  Doing better.   I tried him on zoloft and this caused increased sweats and erectile dysfunction.   He tapered off.  States his blood pressure checks have been a little elevated - with diastolics in the upper 80s-90.  No chest pain reported.  Breathing overall appears to be stable.     Past Medical History  Diagnosis Date  . Hypertension   . Nephrolithiasis   . Bulging disc     evaluated [facility]   . Outpatient Encounter Prescriptions as of 08/02/2014  Medication Sig  . amLODipine (NORVASC) 5 MG tablet Take 5 mg by mouth daily.  . clonazePAM (KLONOPIN) 0.5 MG tablet Take 1 tablet (0.5 mg total) by mouth at bedtime as needed for anxiety.  . [DISCONTINUED] amLODipine (NORVASC) 5 MG tablet Take 1.5 tablets (7.5 mg total) by mouth daily.  . [DISCONTINUED] clonazePAM (KLONOPIN) 0.5 MG tablet Take 1 tablet (0.5 mg total) by mouth at bedtime as needed for anxiety.  . [DISCONTINUED] clonazePAM (KLONOPIN) 0.5 MG tablet Take 1 tablet (0.5 mg total) by mouth at bedtime as needed for anxiety.  . [DISCONTINUED] omeprazole (PRILOSEC) 20 MG  capsule Take 20 mg by mouth daily as needed.  Marland Kitchen. losartan (COZAAR) 25 MG tablet Take 1 tablet (25 mg total) by mouth daily.  Marland Kitchen. omeprazole (PRILOSEC) 40 MG capsule Take 1 capsule (40 mg total) by mouth daily.  . [DISCONTINUED] amLODipine (NORVASC) 5 MG tablet TAKE 1 TABLET (5 MG TOTAL) BY MOUTH DAILY.    Review of Systems Patient denies any headache, lightheadedness or dizziness.  No sinus or allergy symptoms.  No chest pain reported.  No increased shortness of breath or congestion.  Cough as outlined.  Is smoking again.  Her informs me today that his cough did not get better when he quit smoking.  Plans to quit again.  No nausea or vomiting.  No abdominal pain or cramping.  No bowel change, such as diarrhea, constipation, BRBPR or melana.  No urine change.  Blood pressure as outlined above.  Tolerating norvasc.   Increased stress and anxiety as outlined.  Doing better.  Taking clonazepam prn.  Stress better.       Objective:   Physical Exam  Filed Vitals:   08/02/14 0933  BP: 130/90  Pulse: 72  Temp: 98.4 F (36.9 C)   Blood pressure recheck:  59134/8892  43 year old male in no acute distress.  HEENT:  Nares - clear.  Oropharynx - without lesions. NECK:  Supple.  Nontender.  No audible carotid bruit.  HEART:  Appears to be regular.   LUNGS:  No crackles or wheezing audible.  Respirations even and unlabored.   RADIAL PULSE:  Equal bilaterally.  ABDOMEN:  Soft.  Nontender.  Bowel sounds present and normal.  No audible abdominal bruit.  EXTREMITIES:  No increased edema present.  DP pulses palpable and equal bilaterally.       Assessment & Plan:  HEALTH MAINTENANCE.  Physical 07/06/13.   PSA 11/05/13 - .25.   Schedule physical next visit.   I spent 25 minutes with the patient and more than 50% of the time was spent in consultation regarding the above.

## 2014-08-05 NOTE — Telephone Encounter (Signed)
Left a detailed message on pt's VM.

## 2014-08-05 NOTE — Assessment & Plan Note (Signed)
Doing better.  Still takes clonazepam prn.   Follow.  Refilled clonazepam today.

## 2014-08-05 NOTE — Telephone Encounter (Signed)
Notify pt that I have ordered his cxr.  [facility] to [facility].  We discussed this at his appt.

## 2014-08-05 NOTE — Assessment & Plan Note (Signed)
Had an abnormal cxr last year.  Recommended a CT scan.  He declined.  Discussed with him again today.  He continues to decline.  Did agree to a f/u cxr.  Will schedule.

## 2014-08-05 NOTE — Assessment & Plan Note (Signed)
He had previously reported this resolved with quitting smoking.  He denies this today.  He is smoking again.  Discussed the need to quit.  Cough only in the am.  Will place him on omeprazole 40mg  q day.  Follow.  Check cxr as outlined.  Zyrtec one per day as directed.

## 2014-08-07 ENCOUNTER — Encounter: Payer: Self-pay | Admitting: Internal Medicine

## 2014-08-08 ENCOUNTER — Encounter: Payer: Self-pay | Admitting: Internal Medicine

## 2014-08-13 ENCOUNTER — Telehealth: Payer: Self-pay | Admitting: *Deleted

## 2014-08-13 DIAGNOSIS — I10 Essential (primary) hypertension: Secondary | ICD-10-CM

## 2014-08-13 NOTE — Telephone Encounter (Signed)
Lab ordered.

## 2014-08-13 NOTE — Telephone Encounter (Signed)
Pt is coming in Friday what labs and dx?  

## 2014-08-15 ENCOUNTER — Encounter: Payer: Self-pay | Admitting: Internal Medicine

## 2014-08-16 ENCOUNTER — Other Ambulatory Visit: Payer: BC Managed Care – PPO

## 2014-08-20 ENCOUNTER — Other Ambulatory Visit (INDEPENDENT_AMBULATORY_CARE_PROVIDER_SITE_OTHER): Payer: BC Managed Care – PPO

## 2014-08-20 DIAGNOSIS — I10 Essential (primary) hypertension: Secondary | ICD-10-CM

## 2014-08-20 LAB — BASIC METABOLIC PANEL
BUN: 12 mg/dL (ref 6–23)
CO2: 27 mEq/L (ref 19–32)
Calcium: 9.3 mg/dL (ref 8.4–10.5)
Chloride: 103 mEq/L (ref 96–112)
Creatinine, Ser: 1.1 mg/dL (ref 0.4–1.5)
GFR: 78.83 mL/min (ref >60.00)
Glucose, Bld: 103 mg/dL — ABNORMAL HIGH (ref 70–99)
POTASSIUM: 3.9 meq/L (ref 3.5–5.1)
Sodium: 137 mEq/L (ref 135–145)

## 2014-08-21 ENCOUNTER — Encounter: Payer: Self-pay | Admitting: Internal Medicine

## 2014-09-25 ENCOUNTER — Other Ambulatory Visit: Payer: Self-pay | Admitting: Internal Medicine

## 2014-10-14 ENCOUNTER — Other Ambulatory Visit: Payer: Self-pay | Admitting: Internal Medicine

## 2014-10-15 ENCOUNTER — Other Ambulatory Visit: Payer: Self-pay | Admitting: Internal Medicine

## 2014-10-15 ENCOUNTER — Encounter: Payer: BC Managed Care – PPO | Admitting: Internal Medicine

## 2014-10-15 MED ORDER — CLONAZEPAM 0.5 MG PO TABS: 0.5000 mg | ORAL_TABLET | Freq: Every evening | ORAL | Status: DC | PRN

## 2014-10-15 NOTE — Telephone Encounter (Signed)
See my chart message

## 2014-10-15 NOTE — Telephone Encounter (Signed)
Rx faxed

## 2014-10-15 NOTE — Telephone Encounter (Signed)
rx ok'd for clonazepam #30 with no refills.  rx signed and placed on your desk.

## 2014-10-22 ENCOUNTER — Encounter: Payer: Self-pay | Admitting: Internal Medicine

## 2014-10-23 NOTE — Telephone Encounter (Signed)
rx written and placed in your box.  

## 2014-10-24 ENCOUNTER — Other Ambulatory Visit: Payer: Self-pay | Admitting: Internal Medicine

## 2014-11-26 ENCOUNTER — Other Ambulatory Visit: Payer: Self-pay | Admitting: Internal Medicine

## 2014-12-09 ENCOUNTER — Other Ambulatory Visit: Payer: Self-pay | Admitting: Internal Medicine

## 2014-12-09 MED ORDER — NICOTINE 14 MG/24HR TD PT24: 14.0000 mg | MEDICATED_PATCH | Freq: Every day | TRANSDERMAL | Status: DC

## 2014-12-09 NOTE — Telephone Encounter (Signed)
Spoke with pt he states he needs the 14mg  patch.  Please advise

## 2014-12-09 NOTE — Telephone Encounter (Signed)
This is the nicotine patch taper.  He should not need refill on the higher dose if tapering down.

## 2014-12-09 NOTE — Telephone Encounter (Signed)
Sent in refill for 14mg  patch

## 2014-12-09 NOTE — Telephone Encounter (Signed)
Last OV 10.9.15.  Please advise refill 

## 2014-12-12 ENCOUNTER — Other Ambulatory Visit: Payer: Self-pay | Admitting: Internal Medicine

## 2014-12-12 MED ORDER — CLONAZEPAM 0.5 MG PO TABS: 0.5000 mg | ORAL_TABLET | Freq: Every evening | ORAL | Status: DC | PRN

## 2014-12-12 NOTE — Telephone Encounter (Signed)
ok'd refill for clonazepam #30 with no refills.   

## 2014-12-12 NOTE — Telephone Encounter (Signed)
Faxed to pharmacy

## 2014-12-12 NOTE — Telephone Encounter (Signed)
Last visit 08/02/14. Last refill 10/15/14 #30

## 2014-12-26 ENCOUNTER — Encounter: Payer: BC Managed Care – PPO | Admitting: Internal Medicine

## 2014-12-27 ENCOUNTER — Encounter: Payer: Self-pay | Admitting: Internal Medicine

## 2014-12-27 ENCOUNTER — Ambulatory Visit (INDEPENDENT_AMBULATORY_CARE_PROVIDER_SITE_OTHER): Payer: BLUE CROSS/BLUE SHIELD | Admitting: Internal Medicine

## 2014-12-27 VITALS — BP 124/80 | HR 72 | Temp 98.4°F | Ht 69.5 in | Wt 210.5 lb

## 2014-12-27 DIAGNOSIS — N200 Calculus of kidney: Secondary | ICD-10-CM

## 2014-12-27 DIAGNOSIS — R059 Cough, unspecified: Secondary | ICD-10-CM

## 2014-12-27 DIAGNOSIS — Z125 Encounter for screening for malignant neoplasm of prostate: Secondary | ICD-10-CM

## 2014-12-27 DIAGNOSIS — R05 Cough: Secondary | ICD-10-CM

## 2014-12-27 DIAGNOSIS — I10 Essential (primary) hypertension: Secondary | ICD-10-CM

## 2014-12-27 DIAGNOSIS — Z13220 Encounter for screening for lipoid disorders: Secondary | ICD-10-CM

## 2014-12-27 DIAGNOSIS — R9389 Abnormal findings on diagnostic imaging of other specified body structures: Secondary | ICD-10-CM

## 2014-12-27 DIAGNOSIS — Z0000 Encounter for general adult medical examination without abnormal findings: Secondary | ICD-10-CM

## 2014-12-27 DIAGNOSIS — R938 Abnormal findings on diagnostic imaging of other specified body structures: Secondary | ICD-10-CM

## 2014-12-27 DIAGNOSIS — F419 Anxiety disorder, unspecified: Secondary | ICD-10-CM

## 2014-12-27 MED ORDER — ALIGN PO CAPS: 1.0000 | ORAL_CAPSULE | Freq: Every day | ORAL | Status: DC

## 2014-12-27 NOTE — Progress Notes (Signed)
Pre visit review using our clinic review tool, if applicable. No additional management support is needed unless otherwise documented below in the visit note. 

## 2015-01-02 ENCOUNTER — Encounter: Payer: Self-pay | Admitting: Internal Medicine

## 2015-01-02 DIAGNOSIS — Z0000 Encounter for general adult medical examination without abnormal findings: Secondary | ICD-10-CM | POA: Insufficient documentation

## 2015-01-02 NOTE — Assessment & Plan Note (Signed)
Has been worked up by [facility].  Currently asymptomatic.  Follow.

## 2015-01-02 NOTE — Assessment & Plan Note (Signed)
Physical today.  Check psa.   

## 2015-01-02 NOTE — Assessment & Plan Note (Signed)
Blood pressure doing well.  Same medication regimen.  Check metabolic panel.  

## 2015-01-02 NOTE — Progress Notes (Signed)
Patient ID: Fernando Harris, male   DOB: [birth year], 44 y.o.   MRN: [id number]   Subjective:    Patient ID: Fernando ReelBrian A Harris, male    DOB: [birth year], 44 y.o.   MRN: [id number]  HPI  Patient here for his physical exam.  He is taking losartan and amlodipine.  Blood pressure has been previously averaging 126/90.  Has not checked recently.  States has been doing relatively well.  No cardiac symptoms with increased activity or exertion.  Breathing stable.  Discussed diet and exercise.  Bowels stable.  Takes the clonazeapam intermittently.  No urinary issues.  Relationship going well.  New baby daughter is doing well.     Past Medical History  Diagnosis Date  . Hypertension   . Nephrolithiasis   . Bulging disc     [facility]    Current Outpatient Prescriptions on File Prior to Visit  Medication Sig Dispense Refill  . amLODipine (NORVASC) 5 MG tablet Take 1 tablet (5 mg total) by mouth daily. 90 tablet 1  . clonazePAM (KLONOPIN) 0.5 MG tablet Take 1 tablet (0.5 mg total) by mouth at bedtime as needed for anxiety. 30 tablet 0  . losartan (COZAAR) 25 MG tablet TAKE 1 TABLET (25 MG TOTAL) BY MOUTH DAILY. 30 tablet 1  . nicotine (NICODERM CQ - DOSED IN MG/24 HOURS) 14 mg/24hr patch Place 1 patch (14 mg total) onto the skin daily. 28 patch 0  . omeprazole (PRILOSEC) 40 MG capsule TAKE 1 CAPSULE (40 MG TOTAL) BY MOUTH DAILY. 30 capsule 5   No current facility-administered medications on file prior to visit.    Review of Systems  Constitutional: Negative for appetite change and unexpected weight change.  HENT: Negative for congestion and sinus pressure.   Eyes: Negative for pain and visual disturbance.  Respiratory: Positive for cough (persistent minimal cough in am.  ). Negative for chest tightness and shortness of breath.   Cardiovascular: Negative for chest pain, palpitations and leg swelling.  Gastrointestinal: Negative for nausea, vomiting, abdominal pain and diarrhea.    Genitourinary: Negative for frequency and difficulty urinating.  Musculoskeletal: Negative for back pain and joint swelling.  Skin: Negative for color change and rash.  Neurological: Negative for dizziness, light-headedness and headaches.  Hematological: Negative for adenopathy. Does not bruise/bleed easily.  Psychiatric/Behavioral: Negative for dysphoric mood and agitation.       Objective:     Blood pressure recheck:  122/78  Physical Exam  Constitutional: He is oriented to person, place, and time. He appears well-developed and well-nourished. No distress.  HENT:  Nose: Nose normal.  Mouth/Throat: Oropharynx is clear and moist. No oropharyngeal exudate.  Eyes: Conjunctivae are normal. Right eye exhibits no discharge. Left eye exhibits no discharge.  Neck: Neck supple. No thyromegaly present.  Cardiovascular: Normal rate and regular rhythm.   Pulmonary/Chest: Breath sounds normal. No respiratory distress. He has no wheezes.  Abdominal: Soft. Bowel sounds are normal. There is no tenderness.  Genitourinary:  Normal descended testicles.  No nodules appreciated.  Rectal exam reveals no palpable prostate nodules.  Heme negative.    Musculoskeletal: He exhibits no edema or tenderness.  Lymphadenopathy:    He has no cervical adenopathy.  Neurological: He is alert and oriented to person, place, and time.  Skin: Skin is warm and dry. No rash noted.  Psychiatric: He has a normal mood and affect. His behavior is normal.    BP 124/80 mmHg  Pulse 72  Temp(Src) 98.4 F (36.9 C) (  Oral)  Ht 5' 9.5" (1.765 m)  Wt 210 lb 8 oz (95.482 kg)  BMI 30.65 kg/m2  SpO2 96% Wt Readings from Last 3 Encounters:  12/27/14 210 lb 8 oz (95.482 kg)  08/02/14 205 lb 12 oz (93.328 kg)  01/18/14 206 lb 8 oz (93.668 kg)     Lab Results  Component Value Date   WBC 6.0 04/18/2013   HGB 15.3 04/18/2013   HCT 45.6 04/18/2013   PLT 266.0 04/18/2013   GLUCOSE 103* 08/20/2014   CHOL 179 08/02/2014    TRIG 287.0* 08/02/2014   HDL 28.40* 08/02/2014   LDLDIRECT 93.5 08/02/2014   LDLCALC 111* 11/05/2013   ALT 24 08/02/2014   AST 19 08/02/2014   NA 137 08/20/2014   K 3.9 08/20/2014   CL 103 08/20/2014   CREATININE 1.1 08/20/2014   BUN 12 08/20/2014   CO2 27 08/20/2014   TSH 0.45 04/18/2013   PSA 0.25 11/05/2013        Assessment & Plan:   Problem List Items Addressed This Visit    None       Dale Roscoe, MD

## 2015-01-02 NOTE — Assessment & Plan Note (Signed)
Take clonazepam prn.  Stable.  Follow.

## 2015-01-02 NOTE — Assessment & Plan Note (Signed)
Continued minimal smoking in the am.  Have tried PPI.  Has quit smoking.   Previous abnormal cxr.  Have wanted to pursue CT scan.  He has declined and continues to decline.  Recheck cxr.

## 2015-01-02 NOTE — Assessment & Plan Note (Signed)
Has a history of abnormal cxr.  Recommended a f/u CT scan.  He has declined.  Discussed with him today.  Continues to decline.  He is in agreement to repeat cxr.  Schedule.

## 2015-01-07 ENCOUNTER — Other Ambulatory Visit (INDEPENDENT_AMBULATORY_CARE_PROVIDER_SITE_OTHER): Payer: BLUE CROSS/BLUE SHIELD

## 2015-01-07 ENCOUNTER — Ambulatory Visit (INDEPENDENT_AMBULATORY_CARE_PROVIDER_SITE_OTHER)
Admission: RE | Admit: 2015-01-07 | Discharge: 2015-01-07 | Disposition: A | Discharge status: Home / Self Care | Payer: BLUE CROSS/BLUE SHIELD | Source: Ambulatory Visit | Attending: Internal Medicine | Admitting: Internal Medicine

## 2015-01-07 DIAGNOSIS — Z13220 Encounter for screening for lipoid disorders: Secondary | ICD-10-CM

## 2015-01-07 DIAGNOSIS — R9389 Abnormal findings on diagnostic imaging of other specified body structures: Secondary | ICD-10-CM

## 2015-01-07 DIAGNOSIS — R938 Abnormal findings on diagnostic imaging of other specified body structures: Secondary | ICD-10-CM

## 2015-01-07 DIAGNOSIS — Z125 Encounter for screening for malignant neoplasm of prostate: Secondary | ICD-10-CM

## 2015-01-07 DIAGNOSIS — F419 Anxiety disorder, unspecified: Secondary | ICD-10-CM

## 2015-01-07 DIAGNOSIS — I10 Essential (primary) hypertension: Secondary | ICD-10-CM

## 2015-01-07 LAB — COMPREHENSIVE METABOLIC PANEL
ALBUMIN: 4.3 g/dL (ref 3.5–5.2)
ALT: 24 U/L (ref 0–53)
AST: 14 U/L (ref 0–37)
Alkaline Phosphatase: 76 U/L (ref 39–117)
BUN: 12 mg/dL (ref 6–23)
CALCIUM: 9.3 mg/dL (ref 8.4–10.5)
CO2: 33 meq/L — AB (ref 19–32)
Chloride: 104 mEq/L (ref 96–112)
Creatinine, Ser: 1.05 mg/dL (ref 0.40–1.50)
GFR: 82.16 mL/min (ref >60.00)
GLUCOSE: 127 mg/dL — AB (ref 70–99)
Potassium: 4.3 mEq/L (ref 3.5–5.1)
SODIUM: 140 meq/L (ref 135–145)
Total Bilirubin: 0.7 mg/dL (ref 0.2–1.2)
Total Protein: 6.2 g/dL (ref 6.0–8.3)

## 2015-01-07 LAB — CBC WITH DIFFERENTIAL/PLATELET
BASOS PCT: 0.4 % (ref 0.0–3.0)
Basophils Absolute: 0 10*3/uL (ref 0.0–0.1)
Eosinophils Absolute: 0.1 10*3/uL (ref 0.0–0.7)
Eosinophils Relative: 2.1 % (ref 0.0–5.0)
HCT: 44.8 % (ref 39.0–52.0)
HEMOGLOBIN: 15.3 g/dL (ref 13.0–17.0)
Lymphocytes Relative: 26.9 % (ref 12.0–46.0)
Lymphs Abs: 1.6 10*3/uL (ref 0.7–4.0)
MCHC: 34.2 g/dL (ref 30.0–36.0)
MCV: 89.3 fl (ref 78.0–100.0)
MONOS PCT: 8.8 % (ref 3.0–12.0)
Monocytes Absolute: 0.5 10*3/uL (ref 0.1–1.0)
Neutro Abs: 3.7 10*3/uL (ref 1.4–7.7)
Neutrophils Relative %: 61.8 % (ref 43.0–77.0)
Platelets: 251 10*3/uL (ref 150.0–400.0)
RBC: 5.01 Mil/uL (ref 4.22–5.81)
RDW: 13.1 % (ref 11.5–15.5)
WBC: 5.9 10*3/uL (ref 4.0–10.5)

## 2015-01-07 LAB — LIPID PANEL
Cholesterol: 154 mg/dL (ref 0–200)
HDL: 31.1 mg/dL — ABNORMAL LOW (ref >39.00)
NonHDL: 122.9
TRIGLYCERIDES: 221 mg/dL — AB (ref 0.0–149.0)
Total CHOL/HDL Ratio: 5
VLDL: 44.2 mg/dL — AB (ref 0.0–40.0)

## 2015-01-07 LAB — PSA: PSA: 0.24 ng/mL (ref 0.10–4.00)

## 2015-01-07 LAB — TSH: TSH: 0.75 u[IU]/mL (ref 0.35–4.50)

## 2015-01-07 LAB — LDL CHOLESTEROL, DIRECT: Direct LDL: 94 mg/dL

## 2015-01-08 ENCOUNTER — Encounter: Payer: Self-pay | Admitting: Internal Medicine

## 2015-01-08 ENCOUNTER — Other Ambulatory Visit (INDEPENDENT_AMBULATORY_CARE_PROVIDER_SITE_OTHER): Payer: BLUE CROSS/BLUE SHIELD

## 2015-01-08 ENCOUNTER — Other Ambulatory Visit: Payer: Self-pay | Admitting: Internal Medicine

## 2015-01-08 DIAGNOSIS — R739 Hyperglycemia, unspecified: Secondary | ICD-10-CM

## 2015-01-08 LAB — HEMOGLOBIN A1C: Hgb A1c MFr Bld: 5.6 % (ref 4.6–6.5)

## 2015-01-08 NOTE — Telephone Encounter (Signed)
Ok refill? 

## 2015-01-08 NOTE — Telephone Encounter (Signed)
Does he need the 14 or is he tapering and needs the lower dose.  If 14 - ok to refill x 1

## 2015-01-08 NOTE — Progress Notes (Signed)
Order placed for f/u a1c

## 2015-01-10 ENCOUNTER — Encounter: Payer: Self-pay | Admitting: Internal Medicine

## 2015-01-10 MED ORDER — NICOTINE 14 MG/24HR TD PT24: 14.0000 mg | MEDICATED_PATCH | Freq: Every day | TRANSDERMAL | Status: DC

## 2015-01-10 NOTE — Telephone Encounter (Signed)
See other mychart msg, pt requesting 14 mg patches. Rx sent to pharmacy by escript

## 2015-01-10 NOTE — Telephone Encounter (Signed)
Mailed unread message to pt  

## 2015-01-31 ENCOUNTER — Other Ambulatory Visit: Payer: Self-pay | Admitting: Internal Medicine

## 2015-01-31 MED ORDER — NICOTINE 7 MG/24HR TD PT24: 7.0000 mg | MEDICATED_PATCH | Freq: Every day | TRANSDERMAL | Status: DC

## 2015-01-31 NOTE — Telephone Encounter (Signed)
Se mychart msg, needs refill for 7 mg patches

## 2015-01-31 NOTE — Telephone Encounter (Signed)
Refill nicoderm 7mg  #28 with no refills.

## 2015-02-03 ENCOUNTER — Other Ambulatory Visit: Payer: Self-pay | Admitting: Internal Medicine

## 2015-04-07 ENCOUNTER — Other Ambulatory Visit: Payer: Self-pay | Admitting: Internal Medicine

## 2015-04-29 ENCOUNTER — Encounter: Payer: Self-pay | Admitting: Internal Medicine

## 2015-04-29 ENCOUNTER — Ambulatory Visit (INDEPENDENT_AMBULATORY_CARE_PROVIDER_SITE_OTHER): Payer: BLUE CROSS/BLUE SHIELD | Admitting: Internal Medicine

## 2015-04-29 VITALS — BP 120/90 | HR 76 | Temp 98.1°F | Ht 69.5 in | Wt 208.0 lb

## 2015-04-29 DIAGNOSIS — F419 Anxiety disorder, unspecified: Secondary | ICD-10-CM | POA: Diagnosis not present

## 2015-04-29 DIAGNOSIS — R9389 Abnormal findings on diagnostic imaging of other specified body structures: Secondary | ICD-10-CM

## 2015-04-29 DIAGNOSIS — Q766 Other congenital malformations of ribs: Secondary | ICD-10-CM

## 2015-04-29 DIAGNOSIS — I10 Essential (primary) hypertension: Secondary | ICD-10-CM | POA: Diagnosis not present

## 2015-04-29 DIAGNOSIS — R938 Abnormal findings on diagnostic imaging of other specified body structures: Secondary | ICD-10-CM

## 2015-04-29 DIAGNOSIS — Z0000 Encounter for general adult medical examination without abnormal findings: Secondary | ICD-10-CM

## 2015-04-29 DIAGNOSIS — Z720 Tobacco use: Secondary | ICD-10-CM | POA: Insufficient documentation

## 2015-04-29 DIAGNOSIS — E7800 Pure hypercholesterolemia, unspecified: Secondary | ICD-10-CM

## 2015-04-29 DIAGNOSIS — N200 Calculus of kidney: Secondary | ICD-10-CM

## 2015-04-29 LAB — COMPREHENSIVE METABOLIC PANEL
ALT: 23 U/L (ref 0–53)
AST: 17 U/L (ref 0–37)
Albumin: 4.4 g/dL (ref 3.5–5.2)
Alkaline Phosphatase: 78 U/L (ref 39–117)
BUN: 11 mg/dL (ref 6–23)
CHLORIDE: 102 meq/L (ref 96–112)
CO2: 26 meq/L (ref 19–32)
Calcium: 9.6 mg/dL (ref 8.4–10.5)
Creatinine, Ser: 1.04 mg/dL (ref 0.40–1.50)
GFR: 82.95 mL/min (ref >60.00)
Glucose, Bld: 98 mg/dL (ref 70–99)
Potassium: 4 mEq/L (ref 3.5–5.1)
SODIUM: 137 meq/L (ref 135–145)
TOTAL PROTEIN: 7.3 g/dL (ref 6.0–8.3)
Total Bilirubin: 0.7 mg/dL (ref 0.2–1.2)

## 2015-04-29 LAB — LDL CHOLESTEROL, DIRECT: LDL DIRECT: 81 mg/dL

## 2015-04-29 LAB — LIPID PANEL
CHOL/HDL RATIO: 6
Cholesterol: 166 mg/dL (ref 0–200)
HDL: 28.9 mg/dL — AB (ref >39.00)
NONHDL: 137.1
Triglycerides: 341 mg/dL — ABNORMAL HIGH (ref 0.0–149.0)
VLDL: 68.2 mg/dL — ABNORMAL HIGH (ref 0.0–40.0)

## 2015-04-29 MED ORDER — LOSARTAN POTASSIUM 25 MG PO TABS: 25.0000 mg | ORAL_TABLET | Freq: Every day | ORAL | Status: DC

## 2015-04-29 MED ORDER — BUPROPION HCL ER (SR) 150 MG PO TB12: 150.0000 mg | ORAL_TABLET | Freq: Two times a day (BID) | ORAL | Status: DC

## 2015-04-29 NOTE — Assessment & Plan Note (Signed)
Physical 12/27/14.  PSA 01/08/15 - .24.

## 2015-04-29 NOTE — Assessment & Plan Note (Signed)
Has declined CT chest and bone scan.  cxr revealed lungs clear.  Comment on bone abnormality - left ninth rib.  See xray report for details.  Check rib xray.  Further w/up pending results.

## 2015-04-29 NOTE — Assessment & Plan Note (Signed)
Taking clonazepam prn.  Start wellbutrin as directed.  Will help with smoking.  Follow.

## 2015-04-29 NOTE — Assessment & Plan Note (Signed)
Discussed treatment options.  Start wellbutrin.  Follow closely.  Discussed at length with him today.  Get him back in soon to reassess.  Instructed to not mix the wellbutrin and alcohol.

## 2015-04-29 NOTE — Progress Notes (Signed)
Pre visit review using our clinic review tool, if applicable. No additional management support is needed unless otherwise documented below in the visit note. 

## 2015-04-29 NOTE — Assessment & Plan Note (Signed)
Blood pressure as outlined.  Same medication regimen.  Follow pressures.  Follow metabolic panel.  

## 2015-04-29 NOTE — Assessment & Plan Note (Signed)
Low cholesterol diet and exercise.  Follow lipid panel.   

## 2015-04-29 NOTE — Assessment & Plan Note (Signed)
Has been worked up by [facility].  Currently asymptomatic.

## 2015-04-29 NOTE — Progress Notes (Signed)
Patient ID: Fernando Harris, male   DOB: 1971-02-21, 44 y.o.   MRN: [id number]    Subjective:    Patient ID: Fernando Harris, male    DOB: Oct 07, 1971, 44 y.o.   MRN: [id number]  HPI  Patient here for a scheduled follow up.  Had tried to stop smoking.  Used nicotine patch.  Did well when on the patch.  Restarted - smoking - < 1ppd.  Desires to try something else to help him quit smoking.  Discussed treatment options.  Has some issues with anxiety and stress.  Work is stressful.  Tries to stay active.  No cardiac symptoms with increased activity or exertion.  No sob.  Bowels stable.  Blood pressure doing better.  Discussed previous cxr results.     Past Medical History  Diagnosis Date  . Hypertension   . Nephrolithiasis   . Bulging disc     evaluated [facility]    Outpatient Encounter Prescriptions as of 04/29/2015  Medication Sig  . amLODipine (NORVASC) 5 MG tablet Take 1 tablet (5 mg total) by mouth daily.  . bifidobacterium infantis (ALIGN) capsule Take 1 capsule by mouth daily.  . clonazePAM (KLONOPIN) 0.5 MG tablet Take 1 tablet (0.5 mg total) by mouth at bedtime as needed for anxiety.  Marland Kitchen losartan (COZAAR) 25 MG tablet Take 1 tablet (25 mg total) by mouth daily.  Marland Kitchen omeprazole (PRILOSEC) 40 MG capsule TAKE 1 CAPSULE (40 MG TOTAL) BY MOUTH DAILY.  . [DISCONTINUED] losartan (COZAAR) 25 MG tablet TAKE 1 TABLET BY MOUTH EVERY DAY  . buPROPion (WELLBUTRIN SR) 150 MG 12 hr tablet Take 1 tablet (150 mg total) by mouth 2 (two) times daily.  . [DISCONTINUED] amLODipine (NORVASC) 5 MG tablet TAKE 1 TABLET BY MOUTH EVERY DAY  . [DISCONTINUED] losartan (COZAAR) 25 MG tablet TAKE 1 TABLET (25 MG TOTAL) BY MOUTH DAILY.  . [DISCONTINUED] nicotine (NICODERM CQ - DOSED IN MG/24 HOURS) 14 mg/24hr patch Place 1 patch (14 mg total) onto the skin daily.  . [DISCONTINUED] nicotine (NICODERM CQ) 7 mg/24hr patch Place 1 patch (7 mg total) onto the skin daily.   No facility-administered encounter  medications on file as of 04/29/2015.    Review of Systems  Constitutional: Negative for appetite change and unexpected weight change.  HENT: Negative for congestion and sinus pressure.   Respiratory: Negative for cough, chest tightness and shortness of breath.   Cardiovascular: Negative for chest pain, palpitations and leg swelling.  Gastrointestinal: Negative for nausea, vomiting, abdominal pain and diarrhea.  Genitourinary: Negative for difficulty urinating.  Musculoskeletal: Negative for joint swelling and neck pain.  Skin: Negative for color change and rash.  Neurological: Negative for dizziness and headaches.  Hematological: Negative for adenopathy. Does not bruise/bleed easily.  Psychiatric/Behavioral: Negative for dysphoric mood.       Increased stress and anxiety as outlined.  Not needing the clonazepam.        Objective:     Blood pressure recheck:  124/84-86  Physical Exam  Constitutional: He appears well-developed and well-nourished. No distress.  HENT:  Nose: Nose normal.  Mouth/Throat: Oropharynx is clear and moist.  Neck: Neck supple. No thyromegaly present.  Cardiovascular: Normal rate and regular rhythm.   Pulmonary/Chest: Effort normal and breath sounds normal. No respiratory distress.  Abdominal: Soft. Bowel sounds are normal. There is no tenderness.  Musculoskeletal: He exhibits no edema.  Lymphadenopathy:    He has no cervical adenopathy.  Skin: No rash noted. No erythema.  Psychiatric:  He has a normal mood and affect. His behavior is normal.    BP 120/90 mmHg  Pulse 76  Temp(Src) 98.1 F (36.7 C) (Oral)  Ht 5' 9.5" (1.765 m)  Wt 208 lb (94.348 kg)  BMI 30.29 kg/m2  SpO2 95% Wt Readings from Last 3 Encounters:  04/29/15 208 lb (94.348 kg)  12/27/14 210 lb 8 oz (95.482 kg)  08/02/14 205 lb 12 oz (93.328 kg)     Lab Results  Component Value Date   WBC 5.9 01/07/2015   HGB 15.3 01/07/2015   HCT 44.8 01/07/2015   PLT 251.0 01/07/2015   GLUCOSE  98 04/29/2015   CHOL 166 04/29/2015   TRIG 341.0* 04/29/2015   HDL 28.90* 04/29/2015   LDLDIRECT 81.0 04/29/2015   LDLCALC 111* 11/05/2013   ALT 23 04/29/2015   AST 17 04/29/2015   NA 137 04/29/2015   K 4.0 04/29/2015   CL 102 04/29/2015   CREATININE 1.04 04/29/2015   BUN 11 04/29/2015   CO2 26 04/29/2015   TSH 0.75 01/07/2015   PSA 0.24 01/07/2015   HGBA1C 5.6 01/08/2015    Dg Chest 2 View  01/07/2015   CLINICAL DATA:  Previous abnormal chest radiograph. Dry cough upon awakening, chronic  EXAM: CHEST  2 VIEW  COMPARISON:  August 23, 2013  FINDINGS: There is again noted a lucent area in the antral lateral left ninth rib with benign-appearing periosteal reaction. This lesion is slightly better seen on the previous study but is not felt to be significantly changed. No other bone lesions seen. There is mild subsegmental atelectasis in the left lung base. Lungs elsewhere are clear. Heart size and pulmonary vascularity are normal. No adenopathy. There is disc narrowing at T11-12, stable.  IMPRESSION: Lucent lesion in the antral lateral left ninth rib with benign appearing periosteal reaction, grossly stable. This finding may warrant rib detail views to further assess. Nuclear medicine bone scan to assess for degree of metabolic activity in this area may also be a reasonable consideration. No other bone lesions seen. Lungs are clear except for slight subsegmental atelectasis in the left base.   Electronically Signed   By: Bretta BangWilliam  Woodruff III M.D.   On: 01/07/2015 09:34       Assessment & Plan:   Problem List Items Addressed This Visit    Abnormal CXR    Has declined CT chest and bone scan.  cxr revealed lungs clear.  Comment on bone abnormality - left ninth rib.  See xray report for details.  Check rib xray.  Further w/up pending results.        Anxiety    Taking clonazepam prn.  Start wellbutrin as directed.  Will help with smoking.  Follow.        Relevant Medications   buPROPion  (WELLBUTRIN SR) 150 MG 12 hr tablet   Essential hypertension, benign    Blood pressure as outlined.  Same medication regimen.  Follow pressures.  Follow metabolic panel.       Relevant Medications   losartan (COZAAR) 25 MG tablet   Other Relevant Orders   Comprehensive metabolic panel (Completed)   Health care maintenance    Physical 12/27/14.  PSA 01/08/15 - .24.        Hypercholesterolemia    Low cholesterol diet and exercise.  Follow lipid panel.        Relevant Medications   losartan (COZAAR) 25 MG tablet   Other Relevant Orders   Lipid panel (Completed)   Kidney  stones    Has been worked up by [facility]  Currently asymptomatic.        Tobacco abuse    Discussed treatment options.  Start wellbutrin.  Follow closely.  Discussed at length with him today.  Get him back in soon to reassess.  Instructed to not mix the wellbutrin and alcohol.         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     Dale , MD

## 2015-05-02 NOTE — Telephone Encounter (Signed)
Unread mychart message mailed to patient 

## 2015-07-02 ENCOUNTER — Other Ambulatory Visit: Payer: Self-pay | Admitting: Internal Medicine

## 2015-08-10 ENCOUNTER — Other Ambulatory Visit: Payer: Self-pay | Admitting: Internal Medicine

## 2015-08-11 ENCOUNTER — Other Ambulatory Visit: Payer: Self-pay | Admitting: *Deleted

## 2015-08-11 MED ORDER — BUPROPION HCL ER (SR) 150 MG PO TB12: 150.0000 mg | ORAL_TABLET | Freq: Two times a day (BID) | ORAL | Status: DC

## 2015-08-12 ENCOUNTER — Other Ambulatory Visit: Payer: Self-pay | Admitting: Internal Medicine

## 2015-08-20 ENCOUNTER — Other Ambulatory Visit: Payer: Self-pay

## 2015-08-20 ENCOUNTER — Other Ambulatory Visit: Payer: Self-pay | Admitting: Internal Medicine

## 2015-08-20 MED ORDER — CLONAZEPAM 0.5 MG PO TABS: 0.5000 mg | ORAL_TABLET | Freq: Every evening | ORAL | Status: DC | PRN

## 2015-08-20 NOTE — Telephone Encounter (Signed)
Please advise refill? 

## 2015-08-20 NOTE — Telephone Encounter (Signed)
ok'd clonazepam #30 with no refills.  rx signed and placed on your desk.

## 2015-08-31 ENCOUNTER — Emergency Department: Payer: BLUE CROSS/BLUE SHIELD

## 2015-08-31 ENCOUNTER — Encounter: Payer: Self-pay | Admitting: *Deleted

## 2015-08-31 ENCOUNTER — Emergency Department
Admission: EM | Admit: 2015-08-31 | Discharge: 2015-08-31 | Discharge status: Against Medical Advice | Payer: BLUE CROSS/BLUE SHIELD | Attending: Emergency Medicine | Admitting: Emergency Medicine

## 2015-08-31 DIAGNOSIS — I10 Essential (primary) hypertension: Secondary | ICD-10-CM | POA: Insufficient documentation

## 2015-08-31 DIAGNOSIS — R079 Chest pain, unspecified: Secondary | ICD-10-CM | POA: Diagnosis present

## 2015-08-31 DIAGNOSIS — Z720 Tobacco use: Secondary | ICD-10-CM | POA: Diagnosis not present

## 2015-08-31 DIAGNOSIS — R202 Paresthesia of skin: Secondary | ICD-10-CM | POA: Insufficient documentation

## 2015-08-31 DIAGNOSIS — M542 Cervicalgia: Secondary | ICD-10-CM | POA: Diagnosis not present

## 2015-08-31 DIAGNOSIS — R200 Anesthesia of skin: Secondary | ICD-10-CM | POA: Diagnosis not present

## 2015-08-31 DIAGNOSIS — Z79899 Other long term (current) drug therapy: Secondary | ICD-10-CM | POA: Diagnosis not present

## 2015-08-31 LAB — CBC
HCT: 45.3 % (ref 40.0–52.0)
HEMOGLOBIN: 15.2 g/dL (ref 13.0–18.0)
MCH: 29.8 pg (ref 26.0–34.0)
MCHC: 33.6 g/dL (ref 32.0–36.0)
MCV: 88.5 fL (ref 80.0–100.0)
Platelets: 224 10*3/uL (ref 150–440)
RBC: 5.12 MIL/uL (ref 4.40–5.90)
RDW: 13.8 % (ref 11.5–14.5)
WBC: 7.6 10*3/uL (ref 3.8–10.6)

## 2015-08-31 LAB — BASIC METABOLIC PANEL
ANION GAP: 4 — AB (ref 5–15)
BUN: 18 mg/dL (ref 6–20)
CALCIUM: 8.7 mg/dL — AB (ref 8.9–10.3)
CO2: 24 mmol/L (ref 22–32)
Chloride: 107 mmol/L (ref 101–111)
Creatinine, Ser: 1.06 mg/dL (ref 0.61–1.24)
Glucose, Bld: 100 mg/dL — ABNORMAL HIGH (ref 65–99)
Potassium: 3.9 mmol/L (ref 3.5–5.1)
SODIUM: 135 mmol/L (ref 135–145)

## 2015-08-31 LAB — TROPONIN I

## 2015-08-31 NOTE — ED Provider Notes (Signed)
[facility] Emergency Department Provider Note  ____________________________________________  Time seen: Approximately 11:15 PM  I have reviewed the triage vital signs and the nursing notes.   HISTORY  Chief Complaint Chest Pain    HPI Fernando Harris is a 44 y.o. male who comes into the hospital with multiple symptoms for the last 2 weeks. The patient reports that he's had some aching arms on and off for the last couple of weeks. He reports that his arms fall asleep when he is asleep but today his right side was having some numbness and aching when he was sitting up watching television. The patient is also had some sharp pains and pressure in his chest for the last couple of weeks. The patient reports that he has had some neck pain as well for the last couple of weeks. The patient has been taking ibuprofen for these pains and it has been helping on and off. The patient reports that he saw his primary care physician a little while back and they suggested a chest x-ray but he had not gotten one yet. He reports that tonight he took his blood pressure and after a few attempts it was higher than normal for him. He reports his blood pressure was 156/102. 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 He does not have any significant back pain although he's had episodes in the past. He reports that he was watching TV when he had the chest pain and reports that his chest really feels heavy. He felt as though his hands were cold and clammy but he denies nausea vomiting and sweats. Tonight the chest pain was starting at 9:00 so he decided to come in to be evaluated.   Past Medical History  Diagnosis Date  . Hypertension   . Nephrolithiasis   . Bulging disc     evaluated [facility]    Patient Active Problem List   Diagnosis Date Noted  . Hypercholesterolemia 04/29/2015  . Tobacco  abuse 04/29/2015  . Health care maintenance 01/02/2015  . Abnormal CXR 08/05/2014  . Chest pain 01/18/2014  . Anxiety 09/23/2013  . Abrasion of arm, left 08/26/2013  . Rash 07/09/2013  . Kidney stones 04/22/2013  . Essential hypertension, benign 04/22/2013  . Cough 04/22/2013  . Back pain 04/22/2013    Past Surgical History  Procedure Laterality Date  . Tonsillectomy    . Inguinal hernia repair    . Elbow surgery      Current Outpatient Rx  Name  Route  Sig  Dispense  Refill  . amLODipine (NORVASC) 5 MG tablet   Oral   Take 1 tablet (5 mg total) by mouth daily.   90 tablet   1   . bifidobacterium infantis (ALIGN) capsule   Oral   Take 1 capsule by mouth daily.   30 capsule   2   . buPROPion (WELLBUTRIN SR) 150 MG 12 hr tablet   Oral   Take 1 tablet (150 mg total) by mouth 2 (two) times daily.   60 tablet   2   . buPROPion (WELLBUTRIN SR) 150 MG 12 hr tablet      TAKE 1 TABLET BY MOUTH 2 TIMES DAILY.   60 tablet   2   . clonazePAM (KLONOPIN) 0.5 MG tablet   Oral   Take 1 tablet (0.5 mg total) by mouth at bedtime  as needed for anxiety.   30 tablet   0   . losartan (COZAAR) 25 MG tablet   Oral   Take 1 tablet (25 mg total) by mouth daily.   90 tablet   1   . omeprazole (PRILOSEC) 40 MG capsule      TAKE 1 CAPSULE (40 MG TOTAL) BY MOUTH DAILY.   30 capsule   5   . omeprazole (PRILOSEC) 40 MG capsule      TAKE ONE CAPSULE BY MOUTH EVERY DAY   30 capsule   5     Allergies Aleve  Family History  Problem Relation Age of Onset  . Hypertension Father   . Diabetes Paternal Grandmother     Social History Social History  Substance Use Topics  . Smoking status: Current Every Day Smoker    Types: Cigarettes  . Smokeless tobacco: Current User    Types: Snuff    Last Attempt to Quit: 08/21/2013     Comment: still dips  . Alcohol Use: 0.0 oz/week    0 Standard drinks or equivalent per week    Review of Systems Constitutional: No  fever/chills Eyes: No visual changes. ENT: No sore throat. Cardiovascular:  chest pain. Respiratory: Denies shortness of breath. Gastrointestinal: No abdominal pain.  No nausea, no vomiting.  No diarrhea.  No constipation. Genitourinary: Negative for dysuria. Musculoskeletal: neck pain Skin: Negative for rash. Neurological: numbness and tingling in bilateral arms.  10-point ROS otherwise negative.  ____________________________________________   PHYSICAL EXAM:  VITAL SIGNS: ED Triage Vitals  Enc Vitals Group     BP 08/31/15 2152 145/91 mmHg     Pulse Rate 08/31/15 2152 86     Resp 08/31/15 2152 20     Temp 08/31/15 2152 98.9 F (37.2 C)     Temp Source 08/31/15 2152 Oral     SpO2 08/31/15 2152 97 %     Weight 08/31/15 2150 205 lb (92.987 kg)     Height 08/31/15 2150 5\' 11"  (1.803 m)     Head Cir --      Peak Flow --      Pain Score 08/31/15 2150 3     Pain Loc --      Pain Edu? --      Excl. in GC? --     Constitutional: Alert and oriented. Well appearing and in no acute distress. Eyes: Conjunctivae are normal. PERRL. EOMI. Head: Atraumatic. Nose: No congestion/rhinnorhea. Mouth/Throat: Mucous membranes are moist.  Oropharynx non-erythematous. Neck: No cervical spine tenderness to palpation Cardiovascular: Normal rate, regular rhythm. Grossly normal heart sounds.  Good peripheral circulation. Respiratory: Normal respiratory effort.  No retractions. Lungs CTAB. Gastrointestinal: Soft and nontender. No distention. Positive bowel sounds Musculoskeletal: No lower extremity tenderness nor edema.   Neurologic:  Normal speech and language. No gross focal neurologic deficits are appreciated.  Skin:  Skin is warm, dry and intact.  Psychiatric: Mood and affect are normal.   ____________________________________________   LABS (all labs ordered are listed, but only abnormal results are displayed)  Labs Reviewed  BASIC METABOLIC PANEL - Abnormal; Notable for the following:     Glucose, Bld 100 (*)    Calcium 8.7 (*)    Anion gap 4 (*)    All other components within normal limits  CBC  TROPONIN I   ____________________________________________  EKG  ED ECG REPORT I, Rebecka ApleyWebster,  Kassady Laboy P, the attending physician, personally viewed and interpreted this ECG.   Date: 08/31/2015  EKG Time:  2153  Rate: 82  Rhythm: normal sinus rhythm  Axis: normal  Intervals:none  ST&T Change: none  ____________________________________________  RADIOLOGY  CXR: NO acute cardiopulmonary process. Minimal Left lung base atelectasis/scarring ____________________________________________   PROCEDURES  Procedure(s) performed: None  Critical Care performed: No  ____________________________________________   INITIAL IMPRESSION / ASSESSMENT AND PLAN / ED COURSE  Pertinent labs & imaging results that were available during my care of the patient were reviewed by me and considered in my medical decision making (see chart for details).  This is a 44 year old male who comes in with multiple complaints of arm numbness tingling and aching, neck pain and chest pain for the last 2 weeks. I informed the patient that his arm pain could be related to his neck pain. Sometimes if he have bulging disks arthritis in her neck he can have symptoms and your arms. The patient reports that he had a surgery on his left arm for tingling in his hands years ago. I also informed the patient that his blood pressure was not severely elevated and that the pain and anxiety can also make that high. I also discussed with the patient that if the workup was negative he still needed to follow-up with his doctor to obtain a stress test and likely an echocardiogram to examine his heart. My plan was to do an x-ray as well as repeat troponin. After I walked out of the room the patient reports that he does not want the x-ray and then determine he did not want want to have another troponin. He reports that he has to  get home to his children. Since I'm not able to fully evaluate the patient's chest pain with 2 enzymes and his pain started again tonight at 9:00 I feel that the patient does need to sign out AGAINST MEDICAL ADVICE and understands the risks of leaving. The patient does accept those risks and he will sign out AMA. ____________________________________________   FINAL CLINICAL IMPRESSION(S) / ED DIAGNOSES  Final diagnoses:  Chest pain, unspecified chest pain type  Paresthesia of arm      Rebecka Apley, MD 09/01/15 [phone]

## 2015-08-31 NOTE — ED Notes (Signed)
Pt reports chest pain intermittently for 2 weeks.  Pain radiates into right arm.  Pt reports pain worse at night.  No n/v/d.  cig smoker.

## 2015-08-31 NOTE — ED Notes (Signed)
Pt called out to RN requesting to go home. Pt states "must get home to my kids" Pt made aware for the need to sign out AMA, educated on risks v benefit, pt still requesting to sign out AMA. MD Zenda AlpersWebster made aware, no further orders given at this time. Charge RN Dawn made aware as well. Pt calm and cooperative, vitals wnl, no acute distress noted.

## 2015-10-01 ENCOUNTER — Ambulatory Visit (INDEPENDENT_AMBULATORY_CARE_PROVIDER_SITE_OTHER): Payer: 59 | Admitting: Internal Medicine

## 2015-10-01 ENCOUNTER — Encounter: Payer: Self-pay | Admitting: Internal Medicine

## 2015-10-01 VITALS — BP 124/80 | HR 74 | Temp 99.1°F | Ht 69.5 in | Wt 208.0 lb

## 2015-10-01 DIAGNOSIS — R9389 Abnormal findings on diagnostic imaging of other specified body structures: Secondary | ICD-10-CM

## 2015-10-01 DIAGNOSIS — R938 Abnormal findings on diagnostic imaging of other specified body structures: Secondary | ICD-10-CM

## 2015-10-01 DIAGNOSIS — Z720 Tobacco use: Secondary | ICD-10-CM

## 2015-10-01 DIAGNOSIS — I10 Essential (primary) hypertension: Secondary | ICD-10-CM | POA: Diagnosis not present

## 2015-10-01 DIAGNOSIS — F419 Anxiety disorder, unspecified: Secondary | ICD-10-CM

## 2015-10-01 DIAGNOSIS — R079 Chest pain, unspecified: Secondary | ICD-10-CM | POA: Diagnosis not present

## 2015-10-01 DIAGNOSIS — R0981 Nasal congestion: Secondary | ICD-10-CM

## 2015-10-01 DIAGNOSIS — E7800 Pure hypercholesterolemia, unspecified: Secondary | ICD-10-CM | POA: Diagnosis not present

## 2015-10-01 DIAGNOSIS — M542 Cervicalgia: Secondary | ICD-10-CM

## 2015-10-01 MED ORDER — ALIGN PO CAPS: 1.0000 | ORAL_CAPSULE | Freq: Every day | ORAL | Status: DC

## 2015-10-01 NOTE — Progress Notes (Signed)
Patient ID: Fernando Harris, male   DOB: June 27, 1971, 44 y.o.   MRN: [id number]   Subjective:    Patient ID: Fernando Harris, male    DOB: June 27, 1971, 44 y.o.   MRN: [id number]  HPI  Patient with past history of hypercholesterolemia, tobacco abuse and hypertension.  He comes in today to follow up on these issues.  He was seen in the ER 08/31/15 for chest pain and right arm numbness.  Some neck issues.   Had EKG and CXR.  CXR revealed no acute cardiopulmonary process.  Did have minimal left lung base atelectasis or scarring.  Blood pressure was elevated.  The ER did not complete their w/up secondary to him leaving against medical advice.  See ER note for details.  He reports that since his ER visit, he has not experienced any more chest pain.  Stays active.  No significant increased sob.  We discussed further cardiac w/up.  He declines.   Still some neck issues.  He declines further evaluation for his neck.  He quit smoking two weeks ago.  Discussed the need to remain off cigarettes.  Did have some nasal congestion and fever/cold - starting a few weeks ago.   Is better.  Taking wellbutrin for stress and anxiety.  Feels this is helping.  Does not take the clonazepam regularly.     Past Medical History  Diagnosis Date  . Hypertension   . Nephrolithiasis   . Bulging disc     evaluated [facility]   Past Surgical History  Procedure Laterality Date  . Tonsillectomy    . Inguinal hernia repair    . Elbow surgery     Family History  Problem Relation Age of Onset  . Hypertension Father   . Diabetes Paternal Grandmother    Social History   Social History  . Marital Status: Single    Spouse Name: N/A  . Number of Children: N/A  . Years of Education: N/A   Social History Main Topics  . Smoking status: Former Smoker    Types: Cigarettes  . Smokeless tobacco: Current User    Types: Snuff    Last Attempt to Quit: 08/21/2013     Comment: still dips  . Alcohol Use: 0.0 oz/week    0  Standard drinks or equivalent per week  . Drug Use: No  . Sexual Activity: Not Asked   Other Topics Concern  . None   Social History Narrative    Outpatient Encounter Prescriptions as of 10/01/2015  Medication Sig  . amLODipine (NORVASC) 5 MG tablet Take 1 tablet (5 mg total) by mouth daily.  . bifidobacterium infantis (ALIGN) capsule Take 1 capsule by mouth daily.  Marland Kitchen. buPROPion (WELLBUTRIN SR) 150 MG 12 hr tablet Take 1 tablet (150 mg total) by mouth 2 (two) times daily.  . clonazePAM (KLONOPIN) 0.5 MG tablet Take 1 tablet (0.5 mg total) by mouth at bedtime as needed for anxiety.  Marland Kitchen. losartan (COZAAR) 25 MG tablet Take 1 tablet (25 mg total) by mouth daily.  Marland Kitchen. omeprazole (PRILOSEC) 40 MG capsule TAKE 1 CAPSULE (40 MG TOTAL) BY MOUTH DAILY.  . bifidobacterium infantis (ALIGN) capsule Take 1 capsule by mouth daily.  . [DISCONTINUED] buPROPion (WELLBUTRIN SR) 150 MG 12 hr tablet TAKE 1 TABLET BY MOUTH 2 TIMES DAILY.  . [DISCONTINUED] omeprazole (PRILOSEC) 40 MG capsule TAKE ONE CAPSULE BY MOUTH EVERY DAY   No facility-administered encounter medications on file as of 10/01/2015.    Review of Systems  Constitutional: Negative for appetite change and unexpected weight change.  HENT: Negative for sinus pressure.        No significant congestion now.    Eyes: Negative for pain and discharge.  Respiratory: Negative for cough, chest tightness and shortness of breath.   Cardiovascular: Negative for chest pain, palpitations and leg swelling.  Gastrointestinal: Negative for nausea, vomiting, abdominal pain and diarrhea.  Genitourinary: Negative for dysuria and difficulty urinating.  Musculoskeletal: Negative for back pain.       Some neck discomfort as outlined.    Skin: Negative for color change and rash.  Neurological: Negative for dizziness, light-headedness and headaches.  Psychiatric/Behavioral: Negative for dysphoric mood and agitation.       Objective:    Physical Exam    Constitutional: He appears well-developed and well-nourished. No distress.  HENT:  Nose: Nose normal.  Mouth/Throat: Oropharynx is clear and moist.  Eyes: Conjunctivae are normal. Right eye exhibits no discharge. Left eye exhibits no discharge.  Neck: Neck supple. No thyromegaly present.  Cardiovascular: Normal rate and regular rhythm.   Pulmonary/Chest: Effort normal and breath sounds normal. No respiratory distress.  Abdominal: Soft. Bowel sounds are normal. There is no tenderness.  Musculoskeletal: He exhibits no edema or tenderness.  Minimal neck discomfort with rotation of his head.  Motor strength appears to be equal bilateral lower extremities.    Lymphadenopathy:    He has no cervical adenopathy.  Skin: No rash noted. No erythema.  Psychiatric: He has a normal mood and affect. His behavior is normal.    BP 124/80 mmHg  Pulse 74  Temp(Src) 99.1 F (37.3 C) (Oral)  Ht 5' 9.5" (1.765 m)  Wt 208 lb (94.348 kg)  BMI 30.29 kg/m2  SpO2 97% Wt Readings from Last 3 Encounters:  10/01/15 208 lb (94.348 kg)  08/31/15 205 lb (92.987 kg)  04/29/15 208 lb (94.348 kg)     Lab Results  Component Value Date   WBC 7.6 08/31/2015   HGB 15.2 08/31/2015   HCT 45.3 08/31/2015   PLT 224 08/31/2015   GLUCOSE 100* 08/31/2015   CHOL 166 04/29/2015   TRIG 341.0* 04/29/2015   HDL 28.90* 04/29/2015   LDLDIRECT 81.0 04/29/2015   LDLCALC 111* 11/05/2013   ALT 23 04/29/2015   AST 17 04/29/2015   NA 135 08/31/2015   K 3.9 08/31/2015   CL 107 08/31/2015   CREATININE 1.06 08/31/2015   BUN 18 08/31/2015   CO2 24 08/31/2015   TSH 0.75 01/07/2015   PSA 0.24 01/07/2015   HGBA1C 5.6 01/08/2015    Dg Chest 2 View  08/31/2015  CLINICAL DATA:  Intermittent chest pain for 2 weeks, pain radiates to RIGHT arm. Elevated hypertension. EXAM: CHEST  2 VIEW COMPARISON:  None. FINDINGS: Cardiomediastinal silhouette is normal. Linear densities LEFT lung base. The lungs are otherwise clear without  pleural effusions or focal consolidations. Trachea projects midline and there is no pneumothorax. Soft tissue planes and included osseous structures are non-suspicious. Stable mild cortical thickening and focal sclerosis LEFT posterior ninth rib. IMPRESSION: No acute cardiopulmonary process. Minimal LEFT lung base atelectasis/scarring. Electronically Signed   By: Awilda Metro M.D.   On: 08/31/2015 23:18       Assessment & Plan:   Problem List Items Addressed This Visit    Abnormal CXR    Had f/u cxr in ER.  They commented on minimal left lung base atelectasis/scarring.  Discussed with him regarding need for f/u cxr vs CT.   Would  like to obtain chest CT.  He declines.  Follow.        Anxiety    On wellbutrin.  Doing better.  Has clonazepam if needed.  Follow.       Chest pain (Chronic)    Was seed recently in the ER for chest pain.  Left AMA.  See note.  Discussed further w/up with him today.  He declines.  Wants to follow.  Will notify me if changes his mind.       Essential hypertension, benign - Primary    Blood pressure under good control.  Continue same medication regimen.  Follow pressures.  Follow metabolic panel.        Hypercholesterolemia    Low cholesterol diet and exercise.  Follow lipid panel.        Nasal congestion    Appears to be better.  flonase nasal spray and saline nasal spray as directed.  Follow.        Neck pain    Some neck discomfort as outlined.  Discussed further w/up.  He declines.       Tobacco abuse    On wellbutrin.  Quit smoking two years ago.  Follow.  Encouraged to remain off.            Dale Delmar, MD

## 2015-10-01 NOTE — Progress Notes (Signed)
Pre-visit discussion using our clinic review tool. No additional management support is needed unless otherwise documented below in the visit note.  

## 2015-10-01 NOTE — Patient Instructions (Signed)
Saline nasal spray - flush nose at least 2-3x/day  nasacort nasal spray - 2 sprays each nostril one time per day.  Do this in the evening.  

## 2015-10-06 ENCOUNTER — Encounter: Payer: Self-pay | Admitting: Internal Medicine

## 2015-10-06 DIAGNOSIS — M542 Cervicalgia: Secondary | ICD-10-CM | POA: Insufficient documentation

## 2015-10-06 DIAGNOSIS — R0981 Nasal congestion: Secondary | ICD-10-CM | POA: Insufficient documentation

## 2015-10-06 NOTE — Assessment & Plan Note (Signed)
On wellbutrin.  Doing better.  Has clonazepam if needed.  Follow.

## 2015-10-06 NOTE — Assessment & Plan Note (Signed)
Some neck discomfort as outlined.  Discussed further w/up.  He declines.

## 2015-10-06 NOTE — Assessment & Plan Note (Signed)
Was seed recently in the ER for chest pain.  Left AMA.  See note.  Discussed further w/up with him today.  He declines.  Wants to follow.  Will notify me if changes his mind.

## 2015-10-06 NOTE — Assessment & Plan Note (Signed)
Blood pressure under good control.  Continue same medication regimen.  Follow pressures.  Follow metabolic panel.   

## 2015-10-06 NOTE — Assessment & Plan Note (Signed)
On wellbutrin.  Quit smoking two years ago.  Follow.  Encouraged to remain off.

## 2015-10-06 NOTE — Assessment & Plan Note (Signed)
Had f/u cxr in ER.  They commented on minimal left lung base atelectasis/scarring.  Discussed with him regarding need for f/u cxr vs CT.   Would like to obtain chest CT.  He declines.  Follow.

## 2015-10-06 NOTE — Assessment & Plan Note (Signed)
Appears to be better.  flonase nasal spray and saline nasal spray as directed.  Follow.

## 2015-10-06 NOTE — Assessment & Plan Note (Signed)
Low cholesterol diet and exercise.  Follow lipid panel.   

## 2015-10-30 ENCOUNTER — Other Ambulatory Visit: Payer: Self-pay | Admitting: Internal Medicine

## 2015-12-10 ENCOUNTER — Other Ambulatory Visit: Payer: Self-pay | Admitting: Internal Medicine

## 2016-01-05 ENCOUNTER — Other Ambulatory Visit: Payer: Self-pay | Admitting: Internal Medicine

## 2016-01-13 ENCOUNTER — Encounter: Payer: 59 | Admitting: Internal Medicine

## 2016-01-13 ENCOUNTER — Other Ambulatory Visit: Payer: Self-pay | Admitting: Internal Medicine

## 2016-02-10 ENCOUNTER — Other Ambulatory Visit: Payer: Self-pay | Admitting: Internal Medicine

## 2016-02-26 ENCOUNTER — Telehealth: Payer: Self-pay | Admitting: Internal Medicine

## 2016-02-26 ENCOUNTER — Other Ambulatory Visit: Payer: Self-pay | Admitting: Internal Medicine

## 2016-02-27 ENCOUNTER — Encounter: Payer: Self-pay | Admitting: Internal Medicine

## 2016-02-27 MED ORDER — CLONAZEPAM 0.5 MG PO TABS: 0.5000 mg | ORAL_TABLET | Freq: Every evening | ORAL | Status: DC | PRN

## 2016-02-27 NOTE — Addendum Note (Signed)
Addended by: Charm BargesSCOTT, Jiya Kissinger S on: 02/27/2016 02:04 PM   Modules accepted: Orders

## 2016-02-27 NOTE — Telephone Encounter (Signed)
ok'd refill for clonazepam #30 with no refills.  Message sent to pt.

## 2016-02-27 NOTE — Telephone Encounter (Signed)
ok'd refill for clonazepam #30 with no refills.   

## 2016-02-27 NOTE — Telephone Encounter (Signed)
I ok'd refill for #30 with no refills.

## 2016-02-27 NOTE — Telephone Encounter (Signed)
Clonazepam refill request.  Last seen 09/2015, last filled 08/20/2015.  Please advise.

## 2016-02-27 NOTE — Telephone Encounter (Signed)
Rx faxed to [facility].

## 2016-04-07 ENCOUNTER — Encounter: Payer: 59 | Admitting: Internal Medicine

## 2016-04-13 ENCOUNTER — Other Ambulatory Visit: Payer: Self-pay | Admitting: Internal Medicine

## 2016-04-13 NOTE — Telephone Encounter (Signed)
Last filled on 08/11/15 #60 + 2, last OV 10/01/15. Pt has a physical scheduled on 06/07/16. Ok to refill?

## 2016-04-13 NOTE — Telephone Encounter (Signed)
Your pt

## 2016-04-14 NOTE — Telephone Encounter (Signed)
I am ok to refill until his appt, but need to clarify how he is taking the medication.   He will need to keep appt.

## 2016-05-02 ENCOUNTER — Other Ambulatory Visit: Payer: Self-pay | Admitting: Internal Medicine

## 2016-06-07 ENCOUNTER — Ambulatory Visit (INDEPENDENT_AMBULATORY_CARE_PROVIDER_SITE_OTHER): Payer: 59 | Admitting: Internal Medicine

## 2016-06-07 ENCOUNTER — Other Ambulatory Visit: Payer: Self-pay | Admitting: Internal Medicine

## 2016-06-07 ENCOUNTER — Ambulatory Visit (INDEPENDENT_AMBULATORY_CARE_PROVIDER_SITE_OTHER): Payer: 59

## 2016-06-07 ENCOUNTER — Encounter: Payer: Self-pay | Admitting: Internal Medicine

## 2016-06-07 VITALS — BP 120/80 | HR 74 | Temp 98.4°F | Resp 18 | Ht 69.5 in | Wt 206.4 lb

## 2016-06-07 DIAGNOSIS — M542 Cervicalgia: Secondary | ICD-10-CM | POA: Diagnosis not present

## 2016-06-07 DIAGNOSIS — I10 Essential (primary) hypertension: Secondary | ICD-10-CM | POA: Diagnosis not present

## 2016-06-07 DIAGNOSIS — R938 Abnormal findings on diagnostic imaging of other specified body structures: Secondary | ICD-10-CM

## 2016-06-07 DIAGNOSIS — M79601 Pain in right arm: Secondary | ICD-10-CM

## 2016-06-07 DIAGNOSIS — E7800 Pure hypercholesterolemia, unspecified: Secondary | ICD-10-CM | POA: Diagnosis not present

## 2016-06-07 DIAGNOSIS — Z0000 Encounter for general adult medical examination without abnormal findings: Secondary | ICD-10-CM | POA: Diagnosis not present

## 2016-06-07 DIAGNOSIS — Z125 Encounter for screening for malignant neoplasm of prostate: Secondary | ICD-10-CM | POA: Diagnosis not present

## 2016-06-07 DIAGNOSIS — R202 Paresthesia of skin: Secondary | ICD-10-CM

## 2016-06-07 DIAGNOSIS — R05 Cough: Secondary | ICD-10-CM

## 2016-06-07 DIAGNOSIS — F419 Anxiety disorder, unspecified: Secondary | ICD-10-CM

## 2016-06-07 DIAGNOSIS — R9389 Abnormal findings on diagnostic imaging of other specified body structures: Secondary | ICD-10-CM

## 2016-06-07 DIAGNOSIS — R200 Anesthesia of skin: Secondary | ICD-10-CM

## 2016-06-07 DIAGNOSIS — R059 Cough, unspecified: Secondary | ICD-10-CM

## 2016-06-07 LAB — BASIC METABOLIC PANEL
BUN: 17 mg/dL (ref 6–23)
CALCIUM: 9.1 mg/dL (ref 8.4–10.5)
CO2: 29 mEq/L (ref 19–32)
CREATININE: 1.02 mg/dL (ref 0.40–1.50)
Chloride: 102 mEq/L (ref 96–112)
GFR: 84.39 mL/min (ref >60.00)
GLUCOSE: 78 mg/dL (ref 70–99)
Potassium: 4 mEq/L (ref 3.5–5.1)
SODIUM: 139 meq/L (ref 135–145)

## 2016-06-07 LAB — CBC WITH DIFFERENTIAL/PLATELET
BASOS ABS: 0 10*3/uL (ref 0.0–0.1)
Basophils Relative: 0.3 % (ref 0.0–3.0)
EOS PCT: 1.7 % (ref 0.0–5.0)
Eosinophils Absolute: 0.2 10*3/uL (ref 0.0–0.7)
HCT: 46.5 % (ref 39.0–52.0)
HEMOGLOBIN: 16 g/dL (ref 13.0–17.0)
Lymphocytes Relative: 33.1 % (ref 12.0–46.0)
Lymphs Abs: 3 10*3/uL (ref 0.7–4.0)
MCHC: 34.4 g/dL (ref 30.0–36.0)
MCV: 87.2 fl (ref 78.0–100.0)
MONO ABS: 0.8 10*3/uL (ref 0.1–1.0)
MONOS PCT: 8.9 % (ref 3.0–12.0)
Neutro Abs: 5.1 10*3/uL (ref 1.4–7.7)
Neutrophils Relative %: 56 % (ref 43.0–77.0)
Platelets: 241 10*3/uL (ref 150.0–400.0)
RBC: 5.33 Mil/uL (ref 4.22–5.81)
RDW: 13.4 % (ref 11.5–15.5)
WBC: 9.2 10*3/uL (ref 4.0–10.5)

## 2016-06-07 LAB — HEPATIC FUNCTION PANEL
ALBUMIN: 4.2 g/dL (ref 3.5–5.2)
ALK PHOS: 68 U/L (ref 39–117)
ALT: 26 U/L (ref 0–53)
AST: 13 U/L (ref 0–37)
BILIRUBIN TOTAL: 0.8 mg/dL (ref 0.2–1.2)
Bilirubin, Direct: 0.1 mg/dL (ref 0.0–0.3)
Total Protein: 6.1 g/dL (ref 6.0–8.3)

## 2016-06-07 LAB — LIPID PANEL
CHOLESTEROL: 187 mg/dL (ref 0–200)
HDL: 40 mg/dL (ref >39.00)
NonHDL: 146.67
Total CHOL/HDL Ratio: 5
Triglycerides: 250 mg/dL — ABNORMAL HIGH (ref 0.0–149.0)
VLDL: 50 mg/dL — AB (ref 0.0–40.0)

## 2016-06-07 LAB — LDL CHOLESTEROL, DIRECT: Direct LDL: 105 mg/dL

## 2016-06-07 LAB — PSA: PSA: 0.23 ng/mL (ref 0.10–4.00)

## 2016-06-07 LAB — TSH: TSH: 1.26 u[IU]/mL (ref 0.35–4.50)

## 2016-06-07 NOTE — Progress Notes (Signed)
Patient ID: Fernando Harris, male   DOB: 17-Mar-1971, 45 y.o.   MRN: [id number]   Subjective:    Patient ID: Fernando Harris, male    DOB: 17-Mar-1971, 45 y.o.   MRN: [id number]  HPI  Patient here for his physical exam.  He has stopped smoking.  Off wellbutrin.  Not needing the clonzaepam on a regular basis.  Doing better.  Trying to stay active.  No chest pain.  No sob.  No acid reflux reported.  No abdominal pain or cramping.  Bowels stable.  Reports increased pain right neck/shoulder and right arm.  Persistent.  Worsened recently.  Went to [facility].  On prednisone.  Better.  Discussed further w/up and evaluation given persistent pain.  Agreed to xrays.     Past Medical History:  Diagnosis Date  . Bulging disc    evaluated [facility]  . Hypertension   . Nephrolithiasis    Past Surgical History:  Procedure Laterality Date  . ELBOW SURGERY    . INGUINAL HERNIA REPAIR    . TONSILLECTOMY     Family History  Problem Relation Age of Onset  . Hypertension Father   . Diabetes Paternal Grandmother    Social History   Social History  . Marital status: Single    Spouse name: N/A  . Number of children: N/A  . Years of education: N/A   Social History Main Topics  . Smoking status: Former Smoker    Types: Cigarettes  . Smokeless tobacco: Current User    Types: Snuff    Last attempt to quit: 08/21/2013     Comment: still dips  . Alcohol use 0.0 oz/week  . Drug use: No  . Sexual activity: Not Asked   Other Topics Concern  . None   Social History Narrative  . None    Outpatient Encounter Prescriptions as of 06/07/2016  Medication Sig  . bifidobacterium infantis (ALIGN) capsule Take 1 capsule by mouth daily.  Marland Kitchen. buPROPion (WELLBUTRIN SR) 150 MG 12 hr tablet Take 1 tablet (150 mg total) by mouth 2 (two) times daily.  . clonazePAM (KLONOPIN) 0.5 MG tablet Take 1 tablet (0.5 mg total) by mouth at bedtime as needed for anxiety.  Marland Kitchen. losartan (COZAAR) 25 MG tablet TAKE 1  TABLET (25 MG TOTAL) BY MOUTH DAILY.  Marland Kitchen. omeprazole (PRILOSEC) 40 MG capsule TAKE ONE CAPSULE BY MOUTH EVERY DAY  . [DISCONTINUED] amLODipine (NORVASC) 5 MG tablet TAKE 1 TABLET BY MOUTH EVERY DAY  . [DISCONTINUED] bifidobacterium infantis (ALIGN) capsule Take 1 capsule by mouth daily.  . [DISCONTINUED] buPROPion (WELLBUTRIN SR) 150 MG 12 hr tablet TAKE 1 TABLET BY MOUTH 2 TIMES DAILY.  . [DISCONTINUED] clonazePAM (KLONOPIN) 0.5 MG tablet TAKE 1 TABLET BY MOUTH AT BEDTIME AS NEEDED FOR ANXIETY  . [DISCONTINUED] omeprazole (PRILOSEC) 40 MG capsule TAKE ONE CAPSULE BY MOUTH EVERY DAY   No facility-administered encounter medications on file as of 06/07/2016.     Review of Systems  Constitutional: Negative for appetite change and unexpected weight change.  HENT: Negative for congestion and sinus pressure.   Respiratory: Negative for cough, chest tightness and shortness of breath.   Cardiovascular: Negative for chest pain, palpitations and leg swelling.  Gastrointestinal: Negative for abdominal pain, diarrhea, nausea and vomiting.  Genitourinary: Negative for difficulty urinating and dysuria.  Musculoskeletal: Negative for joint swelling and myalgias.       Neck, right shoulder and right arm pain as outlined.    Skin: Negative for color  change and rash.  Neurological: Negative for dizziness, light-headedness and headaches.  Hematological: Negative for adenopathy. Does not bruise/bleed easily.  Psychiatric/Behavioral: Negative for agitation and dysphoric mood.       Objective:     Blood pressure rechecked by me:  120/84  Physical Exam  Constitutional: He is oriented to person, place, and time. He appears well-developed and well-nourished.  HENT:  Head: Normocephalic and atraumatic.  Nose: Nose normal.  Mouth/Throat: Oropharynx is clear and moist. No oropharyngeal exudate.  Eyes: Conjunctivae are normal. Right eye exhibits no discharge. Left eye exhibits no discharge.  Neck: Neck supple.  No thyromegaly present.  Cardiovascular: Normal rate and regular rhythm.   Pulmonary/Chest: Breath sounds normal. No respiratory distress. He has no wheezes.  Abdominal: Soft. Bowel sounds are normal. There is no tenderness.  Genitourinary:  Genitourinary Comments: Normal descended testicles.  No palpable testicular nodules.  Rectal exam - no palpable prostate nodules.  Heme negative.   Musculoskeletal: He exhibits no edema or tenderness.  No significant pain with rotation of his neck.  Some minimal pain with abduction of right arm, especially above 90 degrees.  Motor strength equal and normal bilateral upper extremities. Grip strength wnl.   Lymphadenopathy:    He has no cervical adenopathy.  Neurological: He is alert and oriented to person, place, and time.  Positive tenel's.  Negative phalens.    Skin: Skin is warm and dry. No rash noted. No erythema.  Psychiatric: He has a normal mood and affect. His behavior is normal.    BP 120/80   Pulse 74   Temp 98.4 F (36.9 C) (Oral)   Resp 18   Ht 5' 9.5" (1.765 m)   Wt 206 lb 6 oz (93.6 kg)   SpO2 97%   BMI 30.04 kg/m  Wt Readings from Last 3 Encounters:  06/07/16 206 lb 6 oz (93.6 kg)  10/01/15 208 lb (94.3 kg)  08/31/15 205 lb (93 kg)     Lab Results  Component Value Date   WBC 9.2 06/07/2016   HGB 16.0 06/07/2016   HCT 46.5 06/07/2016   PLT 241.0 06/07/2016   GLUCOSE 78 06/07/2016   CHOL 187 06/07/2016   TRIG 250.0 (H) 06/07/2016   HDL 40.00 06/07/2016   LDLDIRECT 105.0 06/07/2016   LDLCALC 111 (H) 11/05/2013   ALT 26 06/07/2016   AST 13 06/07/2016   NA 139 06/07/2016   K 4.0 06/07/2016   CL 102 06/07/2016   CREATININE 1.02 06/07/2016   BUN 17 06/07/2016   CO2 29 06/07/2016   TSH 1.26 06/07/2016   PSA 0.23 06/07/2016   HGBA1C 5.6 01/08/2015    Dg Chest 2 View  Result Date: 08/31/2015 CLINICAL DATA:  Intermittent chest pain for 2 weeks, pain radiates to RIGHT arm. Elevated hypertension. EXAM: CHEST  2 VIEW  COMPARISON:  None. FINDINGS: Cardiomediastinal silhouette is normal. Linear densities LEFT lung base. The lungs are otherwise clear without pleural effusions or focal consolidations. Trachea projects midline and there is no pneumothorax. Soft tissue planes and included osseous structures are non-suspicious. Stable mild cortical thickening and focal sclerosis LEFT posterior ninth rib. IMPRESSION: No acute cardiopulmonary process. Minimal LEFT lung base atelectasis/scarring. Electronically Signed   By: Awilda Metro M.D.   On: 08/31/2015 23:18       Assessment & Plan:   Problem List Items Addressed This Visit    Abnormal CXR    Cough has resolved since he stopped smoking.  CXR - left lung base atelectasis/scarring.  Follow.  See previous note.       Anxiety    Off wellbutrin.  Not requiring clonazepam on a regular basis.  Follow.  Doing better.       Cough    Resolved after stopping smoking.        Essential hypertension, benign    Blood pressure under good control.  Continue same medication regimen.  Follow pressures.  Follow metabolic panel.        Relevant Orders   CBC with Differential/Platelet (Completed)   TSH (Completed)   Basic metabolic panel (Completed)   Health care maintenance    Physical today 06/07/16.  Check psa and cholesterol with labs today.       Hypercholesterolemia    Low cholesterol diet and exercise.  Follow lipid panel.        Relevant Orders   Lipid panel (Completed)   Hepatic function panel (Completed)   Neck pain - Primary    Pain in neck, but mostly localized to right shoulder and arm.  Check c-spine xray.  On prednisone.  Is better.  Follow.        Relevant Orders   DG Cervical Spine 2 or 3 views (Completed)   Right arm pain    Persistent.  Check neck and shoulder xray.  On prednisone.  Better.  Follow.       Relevant Orders   DG Cervical Spine 2 or 3 views (Completed)   DG Shoulder Right (Completed)    Other Visit Diagnoses    Right  arm numbness       Relevant Orders   DG Cervical Spine 2 or 3 views (Completed)   DG Shoulder Right (Completed)   Prostate cancer screening       Relevant Orders   PSA (Completed)       Dale DurhamSCOTT, Carver Murakami, MD

## 2016-06-07 NOTE — Progress Notes (Signed)
Pre-visit discussion using our clinic review tool. No additional management support is needed unless otherwise documented below in the visit note.  

## 2016-06-07 NOTE — Assessment & Plan Note (Signed)
Physical today 06/07/16.  Check psa and cholesterol with labs today.

## 2016-06-08 ENCOUNTER — Encounter: Payer: Self-pay | Admitting: Internal Medicine

## 2016-06-12 ENCOUNTER — Other Ambulatory Visit: Payer: Self-pay | Admitting: Internal Medicine

## 2016-06-15 ENCOUNTER — Encounter: Payer: Self-pay | Admitting: Internal Medicine

## 2016-06-15 DIAGNOSIS — M25511 Pain in right shoulder: Secondary | ICD-10-CM

## 2016-06-15 DIAGNOSIS — M542 Cervicalgia: Secondary | ICD-10-CM

## 2016-06-15 NOTE — Assessment & Plan Note (Signed)
Pain in neck, but mostly localized to right shoulder and arm.  Check c-spine xray.  On prednisone.  Is better.  Follow.

## 2016-06-15 NOTE — Assessment & Plan Note (Signed)
Off wellbutrin.  Not requiring clonazepam on a regular basis.  Follow.  Doing better.

## 2016-06-15 NOTE — Assessment & Plan Note (Signed)
Persistent.  Check neck and shoulder xray.  On prednisone.  Better.  Follow.

## 2016-06-15 NOTE — Assessment & Plan Note (Signed)
Low cholesterol diet and exercise.  Follow lipid panel.   

## 2016-06-15 NOTE — Assessment & Plan Note (Signed)
Blood pressure under good control.  Continue same medication regimen.  Follow pressures.  Follow metabolic panel.   

## 2016-06-15 NOTE — Assessment & Plan Note (Signed)
Resolved after stopping smoking.

## 2016-06-15 NOTE — Assessment & Plan Note (Signed)
Cough has resolved since he stopped smoking.  CXR - left lung base atelectasis/scarring.  Follow.  See previous note.

## 2016-06-17 ENCOUNTER — Encounter: Payer: Self-pay | Admitting: Internal Medicine

## 2016-06-17 NOTE — Telephone Encounter (Signed)
Pt sent a note this morning regarding the question that Dr Lorin PicketScott had. See note. Thank you!

## 2016-06-17 NOTE — Telephone Encounter (Signed)
Order placed for ortho referral.   

## 2016-06-18 MED ORDER — METHYLPREDNISOLONE 4 MG PO TBPK: ORAL_TABLET | ORAL | 0 refills | Status: DC

## 2016-06-18 NOTE — Telephone Encounter (Signed)
rx sent in for medrol dose pack 6 day taper.  Pt notified via my chart.

## 2016-07-26 ENCOUNTER — Other Ambulatory Visit: Payer: Self-pay | Admitting: Internal Medicine

## 2016-07-26 NOTE — Telephone Encounter (Signed)
Last filled 02/27/16

## 2016-07-26 NOTE — Telephone Encounter (Signed)
Pt has requested an update on this medication Pt [phone]

## 2016-07-26 NOTE — Telephone Encounter (Signed)
Pt called requesting clonazePAM (KLONOPIN) 0.5 MG tablet. He has no more medication.  Pharmacy - [id number] [address], [place] - [phone]  Call pt @ [phone]

## 2016-07-26 NOTE — Telephone Encounter (Signed)
Please advise 

## 2016-07-27 ENCOUNTER — Other Ambulatory Visit: Payer: Self-pay | Admitting: Internal Medicine

## 2016-07-27 ENCOUNTER — Other Ambulatory Visit: Payer: Self-pay

## 2016-07-27 MED ORDER — CLONAZEPAM 0.5 MG PO TABS: 0.5000 mg | ORAL_TABLET | Freq: Every evening | ORAL | 0 refills | Status: DC | PRN

## 2016-07-27 NOTE — Progress Notes (Signed)
Reprinted per Dr.Scott

## 2016-09-07 ENCOUNTER — Ambulatory Visit (INDEPENDENT_AMBULATORY_CARE_PROVIDER_SITE_OTHER): Payer: 59 | Admitting: Internal Medicine

## 2016-09-07 ENCOUNTER — Encounter: Payer: Self-pay | Admitting: Internal Medicine

## 2016-09-07 DIAGNOSIS — Z720 Tobacco use: Secondary | ICD-10-CM | POA: Diagnosis not present

## 2016-09-07 DIAGNOSIS — E7800 Pure hypercholesterolemia, unspecified: Secondary | ICD-10-CM | POA: Diagnosis not present

## 2016-09-07 DIAGNOSIS — M542 Cervicalgia: Secondary | ICD-10-CM

## 2016-09-07 DIAGNOSIS — I10 Essential (primary) hypertension: Secondary | ICD-10-CM

## 2016-09-07 DIAGNOSIS — F419 Anxiety disorder, unspecified: Secondary | ICD-10-CM

## 2016-09-07 DIAGNOSIS — K649 Unspecified hemorrhoids: Secondary | ICD-10-CM

## 2016-09-07 MED ORDER — ALIGN PO CAPS: 1.0000 | ORAL_CAPSULE | Freq: Every day | ORAL | 2 refills | Status: DC

## 2016-09-07 MED ORDER — HYDROCORTISONE ACE-PRAMOXINE 1-1 % RE CREA: 1.0000 "application " | TOPICAL_CREAM | Freq: Two times a day (BID) | RECTAL | 0 refills | Status: DC

## 2016-09-07 MED ORDER — BENEFIBER PO POWD: ORAL | 0 refills | Status: DC

## 2016-09-07 NOTE — Assessment & Plan Note (Signed)
Pain in neck/shoulder and arm.  Saw ortho.  S/p injection.  Helped for a while.  Now with increasing pain.  Discussed restarting exercises.  Plans to start.  Will notify me or ortho if symptoms worsen or persists.

## 2016-09-07 NOTE — Patient Instructions (Addendum)
Align - one per day.    benefiber - one time per day.

## 2016-09-07 NOTE — Assessment & Plan Note (Signed)
Blood pressure on recheck improved.  Continue same medication regimen.  Follow pressures.  Follow metabolic panel.   

## 2016-09-07 NOTE — Assessment & Plan Note (Signed)
Low cholesterol diet and exercise.  Follow lipid panel.   

## 2016-09-07 NOTE — Assessment & Plan Note (Signed)
Not smoking now.  Wearing a patch.  Follow.  Discussed with him today.

## 2016-09-07 NOTE — Progress Notes (Signed)
Pre visit review using our clinic review tool, if applicable. No additional management support is needed unless otherwise documented below in the visit note. 

## 2016-09-07 NOTE — Progress Notes (Signed)
Patient ID: Fernando Harris, male   DOB: [birth year], 45 y.o.   MRN: [id number]   Subjective:    Patient ID: Fernando Harris, male    DOB: September 26, 1971, 45 y.o.   MRN: [id number]  HPI  Patient here for a scheduled follow up.  States he is doing well.  Feels good.  Not exercising as much.  Plans to start doing more.  Not watching his diet.  Blood pressure has been averaging 130/80s - on outside checks.  No chest pain.  No sob.  Trying to stop smoking.  Wearing a nicotine patch now.  No increased cough or congestion.  No abdominal pain or cramping.  Soft stool.  Not a change for him.  Does have problems with hemorrhoids.  Worse over the last month.  Bleeding intermittently.  No blood in the stool.  No straining.  Saw ortho regarding his neck and right shoulder/arm.  S/p injection.  Went to therapy.  Helped.  Starting to have some increased pain now.  Not doing his exercises.  Plans to start.     Past Medical History:  Diagnosis Date  . Bulging disc    evaluated [facility]  . Hypertension   . Nephrolithiasis    Past Surgical History:  Procedure Laterality Date  . ELBOW SURGERY    . INGUINAL HERNIA REPAIR    . TONSILLECTOMY     Family History  Problem Relation Age of Onset  . Hypertension Father   . Diabetes Paternal Grandmother    Social History   Social History  . Marital status: Single    Spouse name: N/A  . Number of children: N/A  . Years of education: N/A   Social History Main Topics  . Smoking status: Former Smoker    Types: Cigarettes  . Smokeless tobacco: Current User    Types: Snuff    Last attempt to quit: 08/21/2013     Comment: still dips  . Alcohol use 0.0 oz/week  . Drug use: No  . Sexual activity: Not Asked   Other Topics Concern  . None   Social History Narrative  . None    Outpatient Encounter Prescriptions as of 09/07/2016  Medication Sig  . amLODipine (NORVASC) 5 MG tablet TAKE 1 TABLET BY MOUTH EVERY DAY  . bifidobacterium infantis  (ALIGN) capsule Take 1 capsule by mouth daily.  Marland Kitchen buPROPion (WELLBUTRIN SR) 150 MG 12 hr tablet Take 1 tablet (150 mg total) by mouth 2 (two) times daily.  . clonazePAM (KLONOPIN) 0.5 MG tablet Take 1 tablet (0.5 mg total) by mouth at bedtime as needed for anxiety.  Marland Kitchen losartan (COZAAR) 25 MG tablet TAKE 1 TABLET (25 MG TOTAL) BY MOUTH DAILY.  Marland Kitchen omeprazole (PRILOSEC) 40 MG capsule TAKE ONE CAPSULE BY MOUTH EVERY DAY  . [DISCONTINUED] methylPREDNISolone (MEDROL DOSEPAK) 4 MG TBPK tablet Medrol dose pack - 6 day taper.  Take as directed.  . bifidobacterium infantis (ALIGN) capsule Take 1 capsule by mouth daily.  . pramoxine-hydrocortisone (ANALPRAM-HC) 1-1 % rectal cream Place 1 application rectally 2 (two) times daily.  . Wheat Dextrin (BENEFIBER) POWD Mix as directed in what you drink in the am daily.   No facility-administered encounter medications on file as of 09/07/2016.     Review of Systems  Constitutional: Negative for appetite change and unexpected weight change.  HENT: Negative for congestion and sinus pressure.   Respiratory: Negative for cough, chest tightness and shortness of breath.   Cardiovascular: Negative for chest pain, palpitations  and leg swelling.  Gastrointestinal: Negative for abdominal pain, diarrhea, nausea and vomiting.       Soft stool.    Genitourinary: Negative for difficulty urinating and dysuria.  Musculoskeletal: Negative for back pain and joint swelling.       Starting to have right arm and neck pain again.    Skin: Negative for color change and rash.  Neurological: Negative for dizziness, light-headedness and headaches.  Psychiatric/Behavioral: Negative for agitation and dysphoric mood.       Objective:    Physical Exam  Constitutional: He appears well-developed and well-nourished. No distress.  HENT:  Nose: Nose normal.  Mouth/Throat: Oropharynx is clear and moist.  Neck: Neck supple. No thyromegaly present.  Cardiovascular: Normal rate and  regular rhythm.   Pulmonary/Chest: Effort normal and breath sounds normal. No respiratory distress.  Abdominal: Soft. Bowel sounds are normal. There is no tenderness.  Genitourinary:  Genitourinary Comments: Rectal exam - small non thrombosed hemorrhoid.  Heme negative.    Musculoskeletal: He exhibits no edema.  Lymphadenopathy:    He has no cervical adenopathy.  Skin: No rash noted. No erythema.  Psychiatric: He has a normal mood and affect. His behavior is normal.    BP 124/80   Pulse 71   Temp 98.3 F (36.8 C) (Oral)   Ht 5\' 10"  (1.778 m)   Wt 211 lb 6.4 oz (95.9 kg)   SpO2 97%   BMI 30.33 kg/m  Wt Readings from Last 3 Encounters:  09/07/16 211 lb 6.4 oz (95.9 kg)  06/07/16 206 lb 6 oz (93.6 kg)  10/01/15 208 lb (94.3 kg)     Lab Results  Component Value Date   WBC 9.2 06/07/2016   HGB 16.0 06/07/2016   HCT 46.5 06/07/2016   PLT 241.0 06/07/2016   GLUCOSE 78 06/07/2016   CHOL 187 06/07/2016   TRIG 250.0 (H) 06/07/2016   HDL 40.00 06/07/2016   LDLDIRECT 105.0 06/07/2016   LDLCALC 111 (H) 11/05/2013   ALT 26 06/07/2016   AST 13 06/07/2016   NA 139 06/07/2016   K 4.0 06/07/2016   CL 102 06/07/2016   CREATININE 1.02 06/07/2016   BUN 17 06/07/2016   CO2 29 06/07/2016   TSH 1.26 06/07/2016   PSA 0.23 06/07/2016   HGBA1C 5.6 01/08/2015    Dg Chest 2 View  Result Date: 08/31/2015 CLINICAL DATA:  Intermittent chest pain for 2 weeks, pain radiates to RIGHT arm. Elevated hypertension. EXAM: CHEST  2 VIEW COMPARISON:  None. FINDINGS: Cardiomediastinal silhouette is normal. Linear densities LEFT lung base. The lungs are otherwise clear without pleural effusions or focal consolidations. Trachea projects midline and there is no pneumothorax. Soft tissue planes and included osseous structures are non-suspicious. Stable mild cortical thickening and focal sclerosis LEFT posterior ninth rib. IMPRESSION: No acute cardiopulmonary process. Minimal LEFT lung base  atelectasis/scarring. Electronically Signed   By: Awilda Metroourtnay  Bloomer M.D.   On: 08/31/2015 23:18       Assessment & Plan:   Problem List Items Addressed This Visit    Anxiety    Doing better.  Follow.        Essential hypertension, benign    Blood pressure on recheck improved.  Continue same medication regimen.  Follow pressures.  Follow metabolic panel.        Hemorrhoid    Analpram cream as directed.  Follow.  Notify me if does not resolve.  Follow.        Hypercholesterolemia    Low cholesterol  diet and exercise.  Follow lipid panel.        Neck pain    Pain in neck/shoulder and arm.  Saw ortho.  S/p injection.  Helped for a while.  Now with increasing pain.  Discussed restarting exercises.  Plans to start.  Will notify me or ortho if symptoms worsen or persists.        Tobacco abuse    Not smoking now.  Wearing a patch.  Follow.  Discussed with him today.           Dale DurhamSCOTT, Sheyenne Konz, MD

## 2016-09-13 ENCOUNTER — Encounter: Payer: Self-pay | Admitting: Internal Medicine

## 2016-09-13 DIAGNOSIS — K644 Residual hemorrhoidal skin tags: Secondary | ICD-10-CM | POA: Insufficient documentation

## 2016-09-13 NOTE — Assessment & Plan Note (Signed)
Analpram cream as directed.  Follow.  Notify me if does not resolve.  Follow.

## 2016-09-13 NOTE — Assessment & Plan Note (Signed)
Doing better.  Follow.   

## 2016-10-04 ENCOUNTER — Ambulatory Visit: Payer: 59 | Admitting: Internal Medicine

## 2016-10-04 DIAGNOSIS — Z0289 Encounter for other administrative examinations: Secondary | ICD-10-CM

## 2016-10-13 ENCOUNTER — Other Ambulatory Visit: Payer: Self-pay | Admitting: Internal Medicine

## 2016-11-10 ENCOUNTER — Other Ambulatory Visit: Payer: Self-pay | Admitting: Internal Medicine

## 2016-12-12 ENCOUNTER — Other Ambulatory Visit: Payer: Self-pay | Admitting: Internal Medicine

## 2016-12-27 ENCOUNTER — Ambulatory Visit (INDEPENDENT_AMBULATORY_CARE_PROVIDER_SITE_OTHER): Payer: 59 | Admitting: Internal Medicine

## 2016-12-27 ENCOUNTER — Encounter: Payer: Self-pay | Admitting: Internal Medicine

## 2016-12-27 DIAGNOSIS — F419 Anxiety disorder, unspecified: Secondary | ICD-10-CM | POA: Diagnosis not present

## 2016-12-27 DIAGNOSIS — I10 Essential (primary) hypertension: Secondary | ICD-10-CM | POA: Diagnosis not present

## 2016-12-27 DIAGNOSIS — R21 Rash and other nonspecific skin eruption: Secondary | ICD-10-CM

## 2016-12-27 DIAGNOSIS — E7800 Pure hypercholesterolemia, unspecified: Secondary | ICD-10-CM

## 2016-12-27 DIAGNOSIS — G629 Polyneuropathy, unspecified: Secondary | ICD-10-CM

## 2016-12-27 LAB — LIPID PANEL
Cholesterol: 167 mg/dL (ref 0–200)
HDL: 29.1 mg/dL — ABNORMAL LOW (ref >39.00)
LDL Cholesterol: 112 mg/dL — ABNORMAL HIGH (ref 0–99)
NonHDL: 137.52
TRIGLYCERIDES: 128 mg/dL (ref 0.0–149.0)
Total CHOL/HDL Ratio: 6
VLDL: 25.6 mg/dL (ref 0.0–40.0)

## 2016-12-27 LAB — COMPREHENSIVE METABOLIC PANEL
ALK PHOS: 68 U/L (ref 39–117)
ALT: 30 U/L (ref 0–53)
AST: 16 U/L (ref 0–37)
Albumin: 4.7 g/dL (ref 3.5–5.2)
BILIRUBIN TOTAL: 0.8 mg/dL (ref 0.2–1.2)
BUN: 13 mg/dL (ref 6–23)
CALCIUM: 9.6 mg/dL (ref 8.4–10.5)
CO2: 27 mEq/L (ref 19–32)
Chloride: 105 mEq/L (ref 96–112)
Creatinine, Ser: 1.1 mg/dL (ref 0.40–1.50)
GFR: 77.15 mL/min (ref >60.00)
Glucose, Bld: 99 mg/dL (ref 70–99)
POTASSIUM: 4.3 meq/L (ref 3.5–5.1)
Sodium: 140 mEq/L (ref 135–145)
TOTAL PROTEIN: 6.7 g/dL (ref 6.0–8.3)

## 2016-12-27 LAB — VITAMIN B12: Vitamin B-12: 257 pg/mL (ref 211–911)

## 2016-12-27 NOTE — Assessment & Plan Note (Signed)
Describes stinging and tingling in both feet.  Worse if standing still.  Ok when walking.  Also notices increased at night.  Check B12.  Discussed further w/up.  Will try supports.  Will notify me if persistent.

## 2016-12-27 NOTE — Assessment & Plan Note (Signed)
Blood pressure under good control.  Continue same medication regimen.  Follow pressures.  Follow metabolic panel.   

## 2016-12-27 NOTE — Progress Notes (Signed)
Pre-visit discussion using our clinic review tool. No additional management support is needed unless otherwise documented below in the visit note.  

## 2016-12-27 NOTE — Assessment & Plan Note (Signed)
Refer back to dermatology

## 2016-12-27 NOTE — Progress Notes (Signed)
sPatient ID: Fernando Harris, male   DOB: [birth year], 46 y.o.   MRN: [id number]   Subjective:    Patient ID: Fernando Harris, male    DOB: 03-26-1971, 46 y.o.   MRN: [id number]  HPI  Patient here for a scheduled follow up.  States he is doing relatively well.  Reports rash on leg. Has had previously.  Medication did not help.  Request referral to dermatology.  Tries to stay active.  No chest pain. No sob.  No acid reflux.  No abdominal pain.  Bowels moving.    Both feet sting.  Handling stress well.  Overall feels better.    Past Medical History:  Diagnosis Date  . Bulging disc    evaluated [facility]  . Hypertension   . Nephrolithiasis    Past Surgical History:  Procedure Laterality Date  . ELBOW SURGERY    . INGUINAL HERNIA REPAIR    . TONSILLECTOMY     Family History  Problem Relation Age of Onset  . Hypertension Father   . Diabetes Paternal Grandmother    Social History   Social History  . Marital status: Single    Spouse name: N/A  . Number of children: N/A  . Years of education: N/A   Social History Main Topics  . Smoking status: Former Smoker    Types: Cigarettes  . Smokeless tobacco: Current User    Types: Snuff    Last attempt to quit: 08/21/2013     Comment: still dips  . Alcohol use 0.0 oz/week  . Drug use: No  . Sexual activity: Not Asked   Other Topics Concern  . None   Social History Narrative  . None    Outpatient Encounter Prescriptions as of 12/27/2016  Medication Sig  . amLODipine (NORVASC) 5 MG tablet TAKE 1 TABLET BY MOUTH EVERY DAY  . bifidobacterium infantis (ALIGN) capsule Take 1 capsule by mouth daily.  Marland Kitchen buPROPion (WELLBUTRIN SR) 150 MG 12 hr tablet Take 1 tablet (150 mg total) by mouth 2 (two) times daily.  . clonazePAM (KLONOPIN) 0.5 MG tablet Take 1 tablet (0.5 mg total) by mouth at bedtime as needed for anxiety.  Marland Kitchen losartan (COZAAR) 25 MG tablet TAKE 1 TABLET (25 MG TOTAL) BY MOUTH DAILY.  Marland Kitchen omeprazole (PRILOSEC) 40 MG  capsule TAKE 1 CAPSULE EVERY DAY  . pramoxine-hydrocortisone (ANALPRAM-HC) 1-1 % rectal cream Place 1 application rectally 2 (two) times daily.  . Wheat Dextrin (BENEFIBER) POWD Mix as directed in what you drink in the am daily.  . [DISCONTINUED] bifidobacterium infantis (ALIGN) capsule Take 1 capsule by mouth daily.  . [DISCONTINUED] losartan (COZAAR) 25 MG tablet TAKE 1 TABLET (25 MG TOTAL) BY MOUTH DAILY.   No facility-administered encounter medications on file as of 12/27/2016.     Review of Systems  Constitutional: Negative for appetite change and unexpected weight change.  HENT: Negative for congestion and sinus pressure.   Respiratory: Negative for cough, chest tightness and shortness of breath.   Cardiovascular: Negative for chest pain, palpitations and leg swelling.  Gastrointestinal: Negative for abdominal pain, diarrhea, nausea and vomiting.  Genitourinary: Negative for difficulty urinating and dysuria.  Musculoskeletal: Negative for back pain and joint swelling.  Skin: Negative for color change and rash.  Neurological: Negative for dizziness, light-headedness and headaches.       Bilateral feet stinging.   Psychiatric/Behavioral: Negative for agitation and dysphoric mood.       Objective:    Physical Exam  Constitutional: He  appears well-developed and well-nourished. No distress.  HENT:  Nose: Nose normal.  Mouth/Throat: Oropharynx is clear and moist.  Neck: Neck supple. No thyromegaly present.  Cardiovascular: Normal rate and regular rhythm.   Pulmonary/Chest: Effort normal and breath sounds normal. No respiratory distress.  Abdominal: Soft. Bowel sounds are normal. There is no tenderness.  Musculoskeletal: He exhibits no edema or tenderness.  No weakness noted.    Lymphadenopathy:    He has no cervical adenopathy.  Skin: No rash noted. No erythema.  Psychiatric: He has a normal mood and affect. His behavior is normal.    BP 118/74 (BP Location: Left Arm, Patient  Position: Sitting, Cuff Size: Large)   Pulse 74   Temp 98.6 F (37 C) (Oral)   Resp 18   Ht 5\' 10"  (1.778 m)   Wt 211 lb 3.2 oz (95.8 kg)   SpO2 96%   BMI 30.30 kg/m  Wt Readings from Last 3 Encounters:  12/27/16 211 lb 3.2 oz (95.8 kg)  09/07/16 211 lb 6.4 oz (95.9 kg)  06/07/16 206 lb 6 oz (93.6 kg)     Lab Results  Component Value Date   WBC 9.2 06/07/2016   HGB 16.0 06/07/2016   HCT 46.5 06/07/2016   PLT 241.0 06/07/2016   GLUCOSE 99 12/27/2016   CHOL 167 12/27/2016   TRIG 128.0 12/27/2016   HDL 29.10 (L) 12/27/2016   LDLDIRECT 105.0 06/07/2016   LDLCALC 112 (H) 12/27/2016   ALT 30 12/27/2016   AST 16 12/27/2016   NA 140 12/27/2016   K 4.3 12/27/2016   CL 105 12/27/2016   CREATININE 1.10 12/27/2016   BUN 13 12/27/2016   CO2 27 12/27/2016   TSH 1.26 06/07/2016   PSA 0.23 06/07/2016   HGBA1C 5.6 01/08/2015    Dg Chest 2 View  Result Date: 08/31/2015 CLINICAL DATA:  Intermittent chest pain for 2 weeks, pain radiates to RIGHT arm. Elevated hypertension. EXAM: CHEST  2 VIEW COMPARISON:  None. FINDINGS: Cardiomediastinal silhouette is normal. Linear densities LEFT lung base. The lungs are otherwise clear without pleural effusions or focal consolidations. Trachea projects midline and there is no pneumothorax. Soft tissue planes and included osseous structures are non-suspicious. Stable mild cortical thickening and focal sclerosis LEFT posterior ninth rib. IMPRESSION: No acute cardiopulmonary process. Minimal LEFT lung base atelectasis/scarring. Electronically Signed   By: Awilda Metroourtnay  Bloomer M.D.   On: 08/31/2015 23:18       Assessment & Plan:   Problem List Items Addressed This Visit    Anxiety    Doing better.  Follow.        Essential hypertension, benign    Blood pressure under good control.  Continue same medication regimen.  Follow pressures.  Follow metabolic panel.        Relevant Orders   Comprehensive metabolic panel (Completed)   Hypercholesterolemia     Low carb diet and exercise.  Check lipid panel.       Relevant Orders   Comprehensive metabolic panel (Completed)   Lipid panel (Completed)   Neuropathy (HCC)    Describes stinging and tingling in both feet.  Worse if standing still.  Ok when walking.  Also notices increased at night.  Check B12.  Discussed further w/up.  Will try supports.  Will notify me if persistent.        Relevant Orders   Vitamin B12 (Completed)   Rash    Refer back to dermatology.        Relevant Orders  Ambulatory referral to Dermatology       Dale Texanna, MD

## 2016-12-27 NOTE — Assessment & Plan Note (Signed)
Low carb diet and exercise.  Check lipid panel.

## 2016-12-28 ENCOUNTER — Telehealth: Payer: Self-pay

## 2016-12-28 NOTE — Telephone Encounter (Signed)
Number busy

## 2016-12-28 NOTE — Telephone Encounter (Signed)
-----   Message from Dale Durhamharlene Scott, MD sent at 12/28/2016  5:23 AM EST ----- Notify pt that his vitamin B level is wnl, but at low end of normal range.  Have him start oral B12 1000mcg q day.  Triglycerides have improved.  Good cholesterol decreased.  Exercise will increase.  Continue a low carb diet and exercise.  We will follow.  Other labs ok.

## 2016-12-29 MED ORDER — B-12 1000 MCG PO CAPS: 1.0000 | ORAL_CAPSULE | Freq: Every day | ORAL | 3 refills | Status: DC

## 2016-12-29 NOTE — Telephone Encounter (Signed)
Pt informed did not have any further questions

## 2016-12-29 NOTE — Telephone Encounter (Signed)
Called pt wanted to know if you could call the b 12 into pharmacy so that he can use his flex card to pay for it.

## 2016-12-29 NOTE — Telephone Encounter (Signed)
rx sent in for oral B12.

## 2017-01-02 NOTE — Assessment & Plan Note (Signed)
Doing better.  Follow.   

## 2017-01-11 ENCOUNTER — Other Ambulatory Visit: Payer: Self-pay | Admitting: Internal Medicine

## 2017-01-11 NOTE — Telephone Encounter (Signed)
Pt called and was looking for an update on this refill  . Pt states that he has no more medication. Please advise, thank you!  Call pt @ [phone]

## 2017-01-12 ENCOUNTER — Other Ambulatory Visit: Payer: Self-pay | Admitting: Internal Medicine

## 2017-01-12 MED ORDER — CLONAZEPAM 0.5 MG PO TABS: 0.5000 mg | ORAL_TABLET | Freq: Every evening | ORAL | 0 refills | Status: DC | PRN

## 2017-01-12 NOTE — Telephone Encounter (Incomplete)
Medication: Klonopin 0.5 Directions:1 po qhs PRN  Last given:  Number refills:  Last o/v:  Follow up:  Labs:

## 2017-01-12 NOTE — Telephone Encounter (Signed)
Last OV 12/27/16 last refill on 07/27/16 for 30 no refills

## 2017-01-12 NOTE — Telephone Encounter (Signed)
Medication: Klonopin 0.5 Directions:1 po qhs PRN  Last given:07/27/16 Number refills: 0 Last o/v: 12-27-16 Follow up: 06-30-17

## 2017-01-12 NOTE — Telephone Encounter (Signed)
Pt called to follow up on the medication refill. Please advise?  Call pt @ [phone].   Pharmacy is [id number] [place] [place] - [address]

## 2017-01-13 NOTE — Telephone Encounter (Signed)
lov 12/27/16  Nov 06/30/17 Last filled 07/27/16

## 2017-01-13 NOTE — Telephone Encounter (Signed)
Refilled ok'd yesterday and they called this am.  Fernando Harris was to call in rx.  Please check with her and see if was able to get through to pharmacy.

## 2017-03-02 ENCOUNTER — Other Ambulatory Visit: Payer: Self-pay | Admitting: Internal Medicine

## 2017-04-25 ENCOUNTER — Other Ambulatory Visit: Payer: Self-pay | Admitting: Internal Medicine

## 2017-06-02 ENCOUNTER — Other Ambulatory Visit: Payer: Self-pay | Admitting: Internal Medicine

## 2017-06-03 NOTE — Telephone Encounter (Signed)
ok'd rx for clonazepam #30 with no refills and losartan #90 with no refills.

## 2017-06-03 NOTE — Telephone Encounter (Signed)
meds were last refilled in January (Losartan) and March (Klonopin) , has upcoming appt in September, please advise for refill, thanks

## 2017-06-06 NOTE — Telephone Encounter (Signed)
Faxed

## 2017-06-30 ENCOUNTER — Encounter: Payer: Self-pay | Admitting: Internal Medicine

## 2017-06-30 ENCOUNTER — Ambulatory Visit (INDEPENDENT_AMBULATORY_CARE_PROVIDER_SITE_OTHER): Payer: 59 | Admitting: Internal Medicine

## 2017-06-30 VITALS — BP 130/70 | HR 66 | Temp 98.5°F | Resp 18 | Ht 70.08 in | Wt 202.2 lb

## 2017-06-30 DIAGNOSIS — F419 Anxiety disorder, unspecified: Secondary | ICD-10-CM

## 2017-06-30 DIAGNOSIS — Z125 Encounter for screening for malignant neoplasm of prostate: Secondary | ICD-10-CM | POA: Diagnosis not present

## 2017-06-30 DIAGNOSIS — Z0000 Encounter for general adult medical examination without abnormal findings: Secondary | ICD-10-CM

## 2017-06-30 DIAGNOSIS — K649 Unspecified hemorrhoids: Secondary | ICD-10-CM | POA: Diagnosis not present

## 2017-06-30 DIAGNOSIS — E538 Deficiency of other specified B group vitamins: Secondary | ICD-10-CM

## 2017-06-30 DIAGNOSIS — I10 Essential (primary) hypertension: Secondary | ICD-10-CM

## 2017-06-30 DIAGNOSIS — Z23 Encounter for immunization: Secondary | ICD-10-CM

## 2017-06-30 DIAGNOSIS — E7800 Pure hypercholesterolemia, unspecified: Secondary | ICD-10-CM | POA: Diagnosis not present

## 2017-06-30 LAB — COMPREHENSIVE METABOLIC PANEL
ALK PHOS: 70 U/L (ref 39–117)
ALT: 19 U/L (ref 0–53)
AST: 14 U/L (ref 0–37)
Albumin: 4.5 g/dL (ref 3.5–5.2)
BUN: 10 mg/dL (ref 6–23)
CO2: 30 meq/L (ref 19–32)
Calcium: 9.7 mg/dL (ref 8.4–10.5)
Chloride: 103 mEq/L (ref 96–112)
Creatinine, Ser: 1.04 mg/dL (ref 0.40–1.50)
GFR: 82.12 mL/min (ref >60.00)
GLUCOSE: 91 mg/dL (ref 70–99)
Potassium: 4.3 mEq/L (ref 3.5–5.1)
SODIUM: 141 meq/L (ref 135–145)
TOTAL PROTEIN: 6.5 g/dL (ref 6.0–8.3)
Total Bilirubin: 0.6 mg/dL (ref 0.2–1.2)

## 2017-06-30 LAB — CBC WITH DIFFERENTIAL/PLATELET
BASOS ABS: 0 10*3/uL (ref 0.0–0.1)
Basophils Relative: 0.5 % (ref 0.0–3.0)
EOS ABS: 0.1 10*3/uL (ref 0.0–0.7)
Eosinophils Relative: 1.9 % (ref 0.0–5.0)
HCT: 48.4 % (ref 39.0–52.0)
Hemoglobin: 16.4 g/dL (ref 13.0–17.0)
LYMPHS ABS: 1.5 10*3/uL (ref 0.7–4.0)
Lymphocytes Relative: 26.4 % (ref 12.0–46.0)
MCHC: 33.8 g/dL (ref 30.0–36.0)
MCV: 89.2 fl (ref 78.0–100.0)
MONOS PCT: 9.1 % (ref 3.0–12.0)
Monocytes Absolute: 0.5 10*3/uL (ref 0.1–1.0)
NEUTROS PCT: 62.1 % (ref 43.0–77.0)
Neutro Abs: 3.6 10*3/uL (ref 1.4–7.7)
Platelets: 218 10*3/uL (ref 150.0–400.0)
RBC: 5.43 Mil/uL (ref 4.22–5.81)
RDW: 13.3 % (ref 11.5–15.5)
WBC: 5.8 10*3/uL (ref 4.0–10.5)

## 2017-06-30 LAB — LIPID PANEL
CHOL/HDL RATIO: 6
Cholesterol: 178 mg/dL (ref 0–200)
HDL: 30.2 mg/dL — ABNORMAL LOW (ref >39.00)
LDL Cholesterol: 117 mg/dL — ABNORMAL HIGH (ref 0–99)
NONHDL: 147.44
Triglycerides: 153 mg/dL — ABNORMAL HIGH (ref 0.0–149.0)
VLDL: 30.6 mg/dL (ref 0.0–40.0)

## 2017-06-30 LAB — PSA: PSA: 0.27 ng/mL (ref 0.10–4.00)

## 2017-06-30 LAB — VITAMIN B12: Vitamin B-12: 689 pg/mL (ref 211–911)

## 2017-06-30 MED ORDER — OMEPRAZOLE 40 MG PO CPDR: 40.0000 mg | DELAYED_RELEASE_CAPSULE | Freq: Every day | ORAL | 1 refills | Status: DC

## 2017-06-30 NOTE — Progress Notes (Signed)
Patient ID: Mayra ReelBrian A Pangallo, male   DOB: 1970/10/29, 46 y.o.   MRN: [id number]   Subjective:    Patient ID: Mayra ReelBrian A Ehrler, male    DOB: 1970/10/29, 46 y.o.   MRN: [id number]  HPI  Patient here for his physical exam.  He reports he is doing relatively well.  Handling stress.  Tries to stay active.  No chest pain.  No sob.  No acid reflux.  No abdominal pain.  Bowels moving.  Weight has decreased some.  Trying to do better watching his diet.  Is having some problems with hemorrhoids.  Persistent problems.  Some bleeding intermittently.  No problems with increased constipation.  Has stopped smoking.  Off the patches as well.     Past Medical History:  Diagnosis Date  . Bulging disc    evaluated [facility]  . Hypertension   . Nephrolithiasis    Past Surgical History:  Procedure Laterality Date  . ELBOW SURGERY    . INGUINAL HERNIA REPAIR    . TONSILLECTOMY     Family History  Problem Relation Age of Onset  . Hypertension Father   . Diabetes Paternal Grandmother    Social History   Social History  . Marital status: Single    Spouse name: N/A  . Number of children: N/A  . Years of education: N/A   Social History Main Topics  . Smoking status: Former Smoker    Types: Cigarettes  . Smokeless tobacco: Current User    Types: Snuff    Last attempt to quit: 08/21/2013     Comment: still dips  . Alcohol use 0.0 oz/week  . Drug use: No  . Sexual activity: Not Asked   Other Topics Concern  . None   Social History Narrative  . None    Outpatient Encounter Prescriptions as of 06/30/2017  Medication Sig  . amLODipine (NORVASC) 5 MG tablet TAKE 1 TABLET BY MOUTH EVERY DAY  . bifidobacterium infantis (ALIGN) capsule Take 1 capsule by mouth daily.  Marland Kitchen. buPROPion (WELLBUTRIN SR) 150 MG 12 hr tablet Take 1 tablet (150 mg total) by mouth 2 (two) times daily.  . clonazePAM (KLONOPIN) 0.5 MG tablet TAKE 1 TABLET BY MOUTH AT BEDTIME AS NEEDED FOR ANXIETY  . [facility] VITAMIN B12 1000  MCG tablet TAKE 1 TABLET BY MOUTH DAILY  . losartan (COZAAR) 25 MG tablet TAKE 1 TABLET (25 MG TOTAL) BY MOUTH DAILY.  Marland Kitchen. omeprazole (PRILOSEC) 40 MG capsule Take 1 capsule (40 mg total) by mouth daily.  . pramoxine-hydrocortisone (ANALPRAM-HC) 1-1 % rectal cream Place 1 application rectally 2 (two) times daily.  . Wheat Dextrin (BENEFIBER) POWD Mix as directed in what you drink in the am daily.  . [DISCONTINUED] omeprazole (PRILOSEC) 40 MG capsule TAKE 1 CAPSULE EVERY DAY   No facility-administered encounter medications on file as of 06/30/2017.     Review of Systems  Constitutional: Negative for appetite change and unexpected weight change.  HENT: Negative for congestion and sinus pressure.   Eyes: Negative for pain and visual disturbance.  Respiratory: Negative for cough, chest tightness and shortness of breath.   Cardiovascular: Negative for chest pain, palpitations and leg swelling.  Gastrointestinal: Negative for abdominal pain, diarrhea, nausea and vomiting.  Genitourinary: Negative for difficulty urinating and dysuria.  Musculoskeletal: Negative for back pain and joint swelling.  Skin: Negative for color change and rash.  Neurological: Negative for dizziness, light-headedness and headaches.  Hematological: Negative for adenopathy. Does not bruise/bleed easily.  Psychiatric/Behavioral:  Negative for agitation and dysphoric mood.       Objective:     Blood pressure rechecked by me:  128/82  Physical Exam  Constitutional: He is oriented to person, place, and time. He appears well-developed and well-nourished. No distress.  HENT:  Head: Normocephalic and atraumatic.  Nose: Nose normal.  Mouth/Throat: Oropharynx is clear and moist. No oropharyngeal exudate.  Eyes: Conjunctivae are normal. Right eye exhibits no discharge. Left eye exhibits no discharge.  Neck: Neck supple. No thyromegaly present.  Cardiovascular: Normal rate and regular rhythm.   Pulmonary/Chest: Breath sounds  normal. No respiratory distress. He has no wheezes.  Abdominal: Soft. Bowel sounds are normal. There is no tenderness.  Genitourinary:  Genitourinary Comments: Rectal exam:  No palpable prostate nodules.  Small external hemorrhoid.  Heme negative.   Musculoskeletal: He exhibits no edema or tenderness.  Lymphadenopathy:    He has no cervical adenopathy.  Neurological: He is alert and oriented to person, place, and time.  Skin: Skin is warm and dry. No rash noted.  Psychiatric: He has a normal mood and affect. His behavior is normal.    BP 130/70 (BP Location: Left Arm, Patient Position: Sitting, Cuff Size: Normal)   Pulse 66   Temp 98.5 F (36.9 C) (Oral)   Resp 18   Ht 5' 10.08" (1.78 m)   Wt 202 lb 3.2 oz (91.7 kg)   SpO2 96%   BMI 28.95 kg/m  Wt Readings from Last 3 Encounters:  06/30/17 202 lb 3.2 oz (91.7 kg)  12/27/16 211 lb 3.2 oz (95.8 kg)  09/07/16 211 lb 6.4 oz (95.9 kg)     Lab Results  Component Value Date   WBC 5.8 06/30/2017   HGB 16.4 06/30/2017   HCT 48.4 06/30/2017   PLT 218.0 06/30/2017   GLUCOSE 91 06/30/2017   CHOL 178 06/30/2017   TRIG 153.0 (H) 06/30/2017   HDL 30.20 (L) 06/30/2017   LDLDIRECT 105.0 06/07/2016   LDLCALC 117 (H) 06/30/2017   ALT 19 06/30/2017   AST 14 06/30/2017   NA 141 06/30/2017   K 4.3 06/30/2017   CL 103 06/30/2017   CREATININE 1.04 06/30/2017   BUN 10 06/30/2017   CO2 30 06/30/2017   TSH 1.26 06/07/2016   PSA 0.27 06/30/2017   HGBA1C 5.6 01/08/2015    Dg Chest 2 View  Result Date: 08/31/2015 CLINICAL DATA:  Intermittent chest pain for 2 weeks, pain radiates to RIGHT arm. Elevated hypertension. EXAM: CHEST  2 VIEW COMPARISON:  None. FINDINGS: Cardiomediastinal silhouette is normal. Linear densities LEFT lung base. The lungs are otherwise clear without pleural effusions or focal consolidations. Trachea projects midline and there is no pneumothorax. Soft tissue planes and included osseous structures are non-suspicious.  Stable mild cortical thickening and focal sclerosis LEFT posterior ninth rib. IMPRESSION: No acute cardiopulmonary process. Minimal LEFT lung base atelectasis/scarring. Electronically Signed   By: Awilda Metro M.D.   On: 08/31/2015 23:18       Assessment & Plan:   Problem List Items Addressed This Visit    Anxiety    Doing better.  Follow.        Essential hypertension, benign    Blood pressure under good control.  Continue same medication regimen.  Follow pressures.  Follow metabolic panel.        Relevant Orders   CBC with Differential/Platelet (Completed)   Health care maintenance    Physical today 06/30/17.  Check psa and cholesterol.  Hemorrhoid    Has tried cream, etc.  Persistent problems.  Occasional bleeding.  Refer to Dr Lemar Livings for evaluation and question of need for further intervention.        Relevant Orders   Ambulatory referral to General Surgery   Hypercholesterolemia - Primary    Discussed diet and exercise.  Follow lipid panel.        Relevant Orders   Comprehensive metabolic panel (Completed)   Lipid panel (Completed)    Other Visit Diagnoses    Prostate cancer screening       Relevant Orders   PSA (Completed)   B12 deficiency       Relevant Orders   Vitamin B12 (Completed)   Need for immunization against influenza       Relevant Orders   Flu Vaccine QUAD 36+ mos IM (Completed)       Dale Decatur, MD

## 2017-07-02 ENCOUNTER — Encounter: Payer: Self-pay | Admitting: Internal Medicine

## 2017-07-02 NOTE — Assessment & Plan Note (Signed)
Physical today 06/30/17.  Check psa and cholesterol.

## 2017-07-02 NOTE — Assessment & Plan Note (Signed)
Doing better.  Follow.   

## 2017-07-02 NOTE — Assessment & Plan Note (Signed)
Blood pressure under good control.  Continue same medication regimen.  Follow pressures.  Follow metabolic panel.   

## 2017-07-02 NOTE — Assessment & Plan Note (Signed)
Discussed diet and exercise.  Follow lipid panel.  

## 2017-07-02 NOTE — Assessment & Plan Note (Signed)
Has tried cream, etc.  Persistent problems.  Occasional bleeding.  Refer to Dr Lemar LivingsByrnett for evaluation and question of need for further intervention.

## 2017-07-04 ENCOUNTER — Other Ambulatory Visit: Payer: Self-pay

## 2017-07-04 ENCOUNTER — Telehealth: Payer: Self-pay | Admitting: General Surgery

## 2017-07-04 ENCOUNTER — Encounter: Payer: Self-pay | Admitting: General Surgery

## 2017-07-04 MED ORDER — ROSUVASTATIN CALCIUM 10 MG PO TABS: 10.0000 mg | ORAL_TABLET | Freq: Every day | ORAL | 2 refills | Status: DC

## 2017-07-04 NOTE — Telephone Encounter (Signed)
I have left a message on patient's cell number asking him to call an schedule an appointment with Dr Lemar LivingsByrnett for hemorrhoids,referred by Dr Dale Durhamharlene Scott. Has not been seen here before.

## 2017-07-15 ENCOUNTER — Other Ambulatory Visit: Payer: Self-pay | Admitting: Internal Medicine

## 2017-07-19 ENCOUNTER — Encounter: Payer: Self-pay | Admitting: General Surgery

## 2017-07-19 ENCOUNTER — Ambulatory Visit (INDEPENDENT_AMBULATORY_CARE_PROVIDER_SITE_OTHER): Payer: 59 | Admitting: General Surgery

## 2017-07-19 VITALS — BP 142/88 | HR 78 | Resp 12 | Ht 71.0 in | Wt 199.0 lb

## 2017-07-19 DIAGNOSIS — K644 Residual hemorrhoidal skin tags: Secondary | ICD-10-CM | POA: Diagnosis not present

## 2017-07-19 NOTE — Progress Notes (Signed)
Patient ID: Fernando Harris, male   DOB: 05-27-1971, 46 y.o.   MRN: [id number]  Chief Complaint  Patient presents with  . Hemorrhoids    HPI Fernando Harris is a 46 y.o. male.  Here today for evaluation of rectal itching with occasional bleeding. He thinks he might wipe too hard to trigger the bleeding, he sees it on the tissue. He can feel the hemorrhoids on the outside. Bowels move daily. He does admit to occasional stools leakage. He feels like the hemorrhoids flare up weekly. He thinks he has had trouble for about 10 years. The rectal cream and tucks pads are helping some. He states he does have trouble with indigestion. He states some foods like cucumbers make it worse. He denies trouble swallowing. He is in car part sales. In the past he did a lot of heavy lifting.   HPI  Past Medical History:  Diagnosis Date  . Bulging disc    evaluated [facility]  . GERD (gastroesophageal reflux disease)   . Hypertension   . Nephrolithiasis     Past Surgical History:  Procedure Laterality Date  . ELBOW SURGERY    . INGUINAL HERNIA REPAIR    . TONSILLECTOMY      Family History  Problem Relation Age of Onset  . Hypertension Father   . Diabetes Paternal Grandmother     Social History Social History  Substance Use Topics  . Smoking status: Former Smoker    Packs/day: 1.00    Years: 15.00    Types: Cigarettes    Quit date: 04/18/2017  . Smokeless tobacco: Former Neurosurgeon    Types: Snuff    Quit date: 08/21/2013  . Alcohol use 0.0 oz/week    Allergies  Allergen Reactions  . Aleve [Naproxen] Swelling    Lip swelling & mouth blisters    Current Outpatient Prescriptions  Medication Sig Dispense Refill  . amLODipine (NORVASC) 5 MG tablet TAKE 1 TABLET BY MOUTH EVERY DAY 90 tablet 1  . clonazePAM (KLONOPIN) 0.5 MG tablet TAKE 1 TABLET BY MOUTH AT BEDTIME AS NEEDED FOR ANXIETY 30 tablet 0  . [facility] VITAMIN B12 1000 MCG tablet TAKE 1 TABLET BY MOUTH DAILY 30 tablet 3  .  losartan (COZAAR) 25 MG tablet TAKE 1 TABLET (25 MG TOTAL) BY MOUTH DAILY. 90 tablet 0  . omeprazole (PRILOSEC) 40 MG capsule Take 1 capsule (40 mg total) by mouth daily. 90 capsule 1  . pramoxine-hydrocortisone (ANALPRAM-HC) 1-1 % rectal cream Place 1 application rectally 2 (two) times daily. 30 g 0  . rosuvastatin (CRESTOR) 10 MG tablet Take 1 tablet (10 mg total) by mouth daily. 30 tablet 2   No current facility-administered medications for this visit.     Review of Systems Review of Systems  Constitutional: Negative.   Respiratory: Negative.   Cardiovascular: Negative.   Gastrointestinal: Negative for constipation and diarrhea.    Blood pressure (!) 142/88, pulse 78, resp. rate 12, height  (1.803 m), weight 199 lb (90.3 kg).  Physical Exam Physical Exam  Constitutional: He is oriented to person, place, and time. He appears well-developed and well-nourished.  HENT:  Mouth/Throat: Oropharynx is clear and moist.  Eyes: Conjunctivae are normal. No scleral icterus.  Neck: Neck supple.  Cardiovascular: Normal rate, regular rhythm and normal heart sounds.   Pulmonary/Chest: Effort normal and breath sounds normal.  Abdominal: Soft. Normal appearance and bowel sounds are normal.  Genitourinary: Rectal exam shows external hemorrhoid. Rectal exam shows no internal hemorrhoid,  no mass, no tenderness and anal tone normal.     Genitourinary Comments: External hemorrhoid and skin tags both front and back with skin irritation External 5 o'clock posterior left and left anterior   Lymphadenopathy:    He has no cervical adenopathy.  Neurological: He is alert and oriented to person, place, and time.  Skin: Skin is warm and dry.  Psychiatric: His behavior is normal.    Data Reviewed Anoscopy was completed. Minimal prominence of the internal hemorrhoid area. No lesions amenable or requiring banding. Visualized lower rectal mucosa was normal. No stool for Hemoccult.  Assessment     Anal bleeding secondary to dermal source on the anal skin.  History stool seepage, likely secondary to swelling of the external hemorrhoid crossing the sphincter.  Long history of reflux, relatively short smoking history. No dysphasia.    Plan    I think most of her problems related to the anal skin and the old external hemorrhoids more so than any process cephalad. With the changing standards by the [employer] the patient could be considered for colonoscopy, but with daily elimination and stool without reported blood and blood on the toilet tissue and rarely in the bowl with the visualized external anal mucosa being what it is I think this is the source rather than an internal source.  The patient has no dysphasia and his reflux symptoms recur if he's been off his PPI for 3-4 days. I think this is probably more anatomic and pathologic primary.  We could proceed upper endoscopy if the patient was interested in an fundoplication for relief of his reflux symptoms without medications, but as he is well-controlled on a daily or every other day dose of PPI I would be inclined to defer endoscopy until the time of his screening colonoscopy at age 49-50.  Prior to jumping right to surgical excision of the redundant skin and asked him to discontinue irritating practices such as using soap on the perianal tissue, being careful dry the area carefully before dressing and to avoid using dry toilet paper for perianal cleansing. If he's not shown significant improvement to-4 weeks we can review the excision of the tissue and anticipated healing.    Avoid soap to the rectal area and continue to use wet wipes to clean area. He will send a message in MyChart with progress in one month. Discussed surgical options vs conservative.  Hold off on endoscopy and colonoscopy at this time.     HPI, Physical Exam, Assessment and Plan have been scribed under the direction and in the presence of Earline Mayotte, MD. Dorathy Daft, RN  I have completed the exam and reviewed the above documentation for accuracy and completeness.  I agree with the above.  Museum/gallery conservator has been used and any errors in dictation or transcription are unintentional.  Donnalee Curry, M.D., F.A.C.S.  Earline Mayotte 07/19/2017, 9:22 AM

## 2017-07-19 NOTE — Patient Instructions (Addendum)
Avoid soap to the rectal area and continue to use wet wipes to clean area send a message in MyChart with progress in one month.   About Hemorrhoids  Hemorrhoids are swollen veins in the lower rectum and anus.  Also called piles, hemorrhoids are a common problem.  Hemorrhoids may be internal (inside the rectum) or external (around the anus).  Internal Hemorrhoids  Internal hemorrhoids are often painless, but they rarely cause bleeding.  The internal veins may stretch and fall down (prolapse) through the anus to the outside of the body.  The veins may then become irritated and painful.  External Hemorrhoids  External hemorrhoids can be easily seen or felt around the anal opening.  They are under the skin around the anus.  When the swollen veins are scratched or broken by straining, rubbing or wiping they sometimes bleed.  How Hemorrhoids Occur  Veins in the rectum and around the anus tend to swell under pressure.  Hemorrhoids can result from increased pressure in the veins of your anus or rectum.  Some sources of pressure are:   Straining to have a bowel movement because of constipation  Waiting too long to have a bowel movement  Coughing and sneezing often  Sitting for extended periods of time, including on the toilet  Diarrhea  Obesity  Trauma or injury to the anus  Some liver diseases  Stress  Family history of hemorrhoids  Pregnancy  Pregnant women should try to avoid becoming constipated, because they are more likely to have hemorrhoids during pregnancy.  In the last trimester of pregnancy, the enlarged uterus may press on blood vessels and causes hemorrhoids.  In addition, the strain of childbirth sometimes causes hemorrhoids after the birth.  Symptoms of Hemorrhoids  Some symptoms of hemorrhoids include:  Swelling and/or a tender lump around the anus  Itching, mild burning and bleeding around the anus  Painful bowel movements with or without  constipation  Bright red blood covering the stool, on toilet paper or in the toilet bowel.   Symptoms usually go away within a few days.  Always talk to your doctor about any bleeding to make sure it is not from some other causes.  Diagnosing and Treating Hemorrhoids  Diagnosis is made by an examination by your healthcare provider.  Special test can be performed by your doctor.    Most cases of hemorrhoids can be treated with:  High-fiber diet: Eat more high-fiber foods, which help prevent constipation.  Ask for more detailed fiber information on types and sources of fiber from your healthcare provider.  Fluids: Drink plenty of water.  This helps soften bowel movements so they are easier to pass.  Sitz baths and cold packs: Sitting in lukewarm water two or three times a day for 15 minutes cleases the anal area and may relieve discomfort.  If the water is too hot, swelling around the anus will get worse.  Placing a cloth-covered ice pack on the anus for ten minutes four times a day can also help reduce selling.  Gently pushing a prolapsed hemorrhoid back inside after the bath or ice pack can be helpful.  Medications: For mild discomfort, your healthcare provider may suggest over-the-counter pain medication or prescribe a cream or ointment for topical use.  The cream may contain witch hazel, zinc oxide or petroleum jelly.  Medicated suppositories are also a treatment option.  Always consult your doctor before applying medications or creams.  Procedures and surgeries: There are also a number of procedures  and surgeries to shrink or remove hemorrhoids in more serious cases.  Talk to your physician about these options.  You can often prevent hemorrhoids or keep them from becoming worse by maintaining a healthy lifestyle.  Eat a fiber-rich diet of fruits, vegetables and whole grains.  Also, drink plenty of water and exercise regularly.   2007, [employer] Doc.30

## 2017-08-12 ENCOUNTER — Telehealth: Payer: Self-pay | Admitting: Radiology

## 2017-08-12 NOTE — Telephone Encounter (Signed)
Pt coming in for labs Monday, please place future orders. Thank you 

## 2017-08-14 ENCOUNTER — Other Ambulatory Visit: Payer: Self-pay | Admitting: Internal Medicine

## 2017-08-14 DIAGNOSIS — E7800 Pure hypercholesterolemia, unspecified: Secondary | ICD-10-CM

## 2017-08-14 NOTE — Progress Notes (Signed)
Order placed for f/u liver panel.  

## 2017-08-14 NOTE — Telephone Encounter (Signed)
Order placed for f/u liver panel.  

## 2017-08-15 ENCOUNTER — Other Ambulatory Visit (INDEPENDENT_AMBULATORY_CARE_PROVIDER_SITE_OTHER): Payer: 59

## 2017-08-15 DIAGNOSIS — E7800 Pure hypercholesterolemia, unspecified: Secondary | ICD-10-CM

## 2017-08-15 LAB — HEPATIC FUNCTION PANEL
ALBUMIN: 4.5 g/dL (ref 3.5–5.2)
ALT: 15 U/L (ref 0–53)
AST: 15 U/L (ref 0–37)
Alkaline Phosphatase: 66 U/L (ref 39–117)
Bilirubin, Direct: 0.1 mg/dL (ref 0.0–0.3)
TOTAL PROTEIN: 6.9 g/dL (ref 6.0–8.3)
Total Bilirubin: 0.7 mg/dL (ref 0.2–1.2)

## 2017-08-16 ENCOUNTER — Other Ambulatory Visit: Payer: Self-pay | Admitting: Internal Medicine

## 2017-08-16 DIAGNOSIS — I10 Essential (primary) hypertension: Secondary | ICD-10-CM

## 2017-08-16 DIAGNOSIS — E7800 Pure hypercholesterolemia, unspecified: Secondary | ICD-10-CM

## 2017-09-12 ENCOUNTER — Other Ambulatory Visit: Payer: Self-pay | Admitting: Internal Medicine

## 2017-09-17 ENCOUNTER — Other Ambulatory Visit: Payer: Self-pay | Admitting: Internal Medicine

## 2017-09-29 ENCOUNTER — Other Ambulatory Visit: Payer: Self-pay | Admitting: Internal Medicine

## 2017-09-30 NOTE — Telephone Encounter (Signed)
Rx faxed to pharmacy  

## 2017-10-27 ENCOUNTER — Other Ambulatory Visit: Payer: Self-pay | Admitting: Internal Medicine

## 2017-10-28 ENCOUNTER — Other Ambulatory Visit (INDEPENDENT_AMBULATORY_CARE_PROVIDER_SITE_OTHER): Payer: 59

## 2017-10-28 DIAGNOSIS — E7800 Pure hypercholesterolemia, unspecified: Secondary | ICD-10-CM

## 2017-10-28 DIAGNOSIS — I10 Essential (primary) hypertension: Secondary | ICD-10-CM | POA: Diagnosis not present

## 2017-10-28 LAB — BASIC METABOLIC PANEL
BUN: 12 mg/dL (ref 6–23)
CALCIUM: 9.6 mg/dL (ref 8.4–10.5)
CO2: 31 mEq/L (ref 19–32)
CREATININE: 1.03 mg/dL (ref 0.40–1.50)
Chloride: 102 mEq/L (ref 96–112)
GFR: 82.92 mL/min (ref >60.00)
GLUCOSE: 98 mg/dL (ref 70–99)
Potassium: 4.2 mEq/L (ref 3.5–5.1)
SODIUM: 140 meq/L (ref 135–145)

## 2017-10-28 LAB — LIPID PANEL
CHOL/HDL RATIO: 4
Cholesterol: 124 mg/dL (ref 0–200)
HDL: 32.3 mg/dL — ABNORMAL LOW (ref >39.00)
LDL Cholesterol: 66 mg/dL (ref 0–99)
NONHDL: 91.47
TRIGLYCERIDES: 129 mg/dL (ref 0.0–149.0)
VLDL: 25.8 mg/dL (ref 0.0–40.0)

## 2017-10-28 LAB — HEPATIC FUNCTION PANEL
ALK PHOS: 69 U/L (ref 39–117)
ALT: 16 U/L (ref 0–53)
AST: 14 U/L (ref 0–37)
Albumin: 4.6 g/dL (ref 3.5–5.2)
BILIRUBIN DIRECT: 0.2 mg/dL (ref 0.0–0.3)
BILIRUBIN TOTAL: 0.7 mg/dL (ref 0.2–1.2)
Total Protein: 6.8 g/dL (ref 6.0–8.3)

## 2017-10-28 LAB — TSH: TSH: 0.96 u[IU]/mL (ref 0.35–4.50)

## 2017-10-31 ENCOUNTER — Encounter: Payer: Self-pay | Admitting: Internal Medicine

## 2017-11-01 ENCOUNTER — Ambulatory Visit: Payer: 59 | Admitting: Internal Medicine

## 2017-11-01 ENCOUNTER — Encounter: Payer: Self-pay | Admitting: Internal Medicine

## 2017-11-01 DIAGNOSIS — F419 Anxiety disorder, unspecified: Secondary | ICD-10-CM

## 2017-11-01 DIAGNOSIS — E7800 Pure hypercholesterolemia, unspecified: Secondary | ICD-10-CM

## 2017-11-01 DIAGNOSIS — I10 Essential (primary) hypertension: Secondary | ICD-10-CM | POA: Diagnosis not present

## 2017-11-01 NOTE — Progress Notes (Signed)
Pre visit review using our clinic review tool, if applicable. No additional management support is needed unless otherwise documented below in the visit note. 

## 2017-11-01 NOTE — Progress Notes (Signed)
Patient ID: Fernando Harris, male   DOB: 10/31/1970, 47 y.o.   MRN: [id number]   Subjective:    Patient ID: Fernando Harris, male    DOB: [birth year], 47 y.o.   MRN: [id number]  HPI  Patient here for a scheduled follow up.  He reports he is doing well.  Feels good.  Stays physically active.  Does not do regular exercise.  No chest pain.  No sob.  No acid reflux.  No abdominal pain.  Bowels moving.     Past Medical History:  Diagnosis Date  . Bulging disc    evaluated  [facility]  . GERD (gastroesophageal reflux disease)   . Hypertension   . Nephrolithiasis    Past Surgical History:  Procedure Laterality Date  . ELBOW SURGERY    . INGUINAL HERNIA REPAIR    . TONSILLECTOMY     Family History  Problem Relation Age of Onset  . Hypertension Father   . Diabetes Paternal Grandmother    Social History   Socioeconomic History  . Marital status: Single    Spouse name: None  . Number of children: None  . Years of education: None  . Highest education level: None  Social Needs  . Financial resource strain: None  . Food insecurity - worry: None  . Food insecurity - inability: None  . Transportation needs - medical: None  . Transportation needs - non-medical: None  Occupational History  . None  Tobacco Use  . Smoking status: Former Smoker    Packs/day: 1.00    Years: 15.00    Pack years: 15.00    Types: Cigarettes    Last attempt to quit: 04/18/2017    Years since quitting: 0.5  . Smokeless tobacco: Former Neurosurgeon    Types: Snuff    Quit date: 08/21/2013  Substance and Sexual Activity  . Alcohol use: Yes    Alcohol/week: 0.0 oz  . Drug use: No  . Sexual activity: None  Other Topics Concern  . None  Social History Narrative  . None    Outpatient Encounter Medications as of 11/01/2017  Medication Sig  . amLODipine (NORVASC) 5 MG tablet TAKE 1 TABLET BY MOUTH EVERY DAY  . clonazePAM (KLONOPIN) 0.5 MG tablet TAKE 1 TABLET BY MOUTH AT BEDTIME AS NEEDED FOR ANXIETY    . [facility] VITAMIN B12 1000 MCG tablet TAKE 1 TABLET BY MOUTH DAILY  . losartan (COZAAR) 25 MG tablet TAKE 1 TABLET BY MOUTH EVERY DAY  . omeprazole (PRILOSEC) 40 MG capsule Take 1 capsule (40 mg total) by mouth daily.  . pramoxine-hydrocortisone (ANALPRAM-HC) 1-1 % rectal cream Place 1 application rectally 2 (two) times daily.  . rosuvastatin (CRESTOR) 10 MG tablet TAKE 1 TABLET BY MOUTH EVERY DAY   No facility-administered encounter medications on file as of 11/01/2017.     Review of Systems  Constitutional: Negative for appetite change and unexpected weight change.  HENT: Negative for congestion and sinus pressure.   Respiratory: Negative for cough, chest tightness and shortness of breath.   Cardiovascular: Negative for chest pain, palpitations and leg swelling.  Gastrointestinal: Negative for abdominal pain, diarrhea, nausea and vomiting.  Genitourinary: Negative for difficulty urinating and dysuria.  Musculoskeletal: Negative for back pain and joint swelling.  Skin: Negative for color change and rash.  Neurological: Negative for dizziness, light-headedness and headaches.  Psychiatric/Behavioral: Negative for agitation and dysphoric mood.       Objective:    Physical Exam  Constitutional: He appears well-developed and  well-nourished. No distress.  Neck: Neck supple. No thyromegaly present.  Cardiovascular: Normal rate and regular rhythm.  Pulmonary/Chest: Effort normal and breath sounds normal. No respiratory distress.  Abdominal: Soft. Bowel sounds are normal. There is no tenderness.  Musculoskeletal: He exhibits no edema or tenderness.  Lymphadenopathy:    He has no cervical adenopathy.  Skin: No rash noted. No erythema.  Psychiatric: He has a normal mood and affect. His behavior is normal.    BP 126/78   Pulse 78   Temp 98.8 F (37.1 C) (Oral)   Ht 5\' 11"  (1.803 m)   Wt 201 lb 12.8 oz (91.5 kg)   SpO2 96%   BMI 28.15 kg/m  Wt Readings from Last 3 Encounters:   11/01/17 201 lb 12.8 oz (91.5 kg)  07/19/17 199 lb (90.3 kg)  06/30/17 202 lb 3.2 oz (91.7 kg)     Lab Results  Component Value Date   WBC 5.8 06/30/2017   HGB 16.4 06/30/2017   HCT 48.4 06/30/2017   PLT 218.0 06/30/2017   GLUCOSE 98 10/28/2017   CHOL 124 10/28/2017   TRIG 129.0 10/28/2017   HDL 32.30 (L) 10/28/2017   LDLDIRECT 105.0 06/07/2016   LDLCALC 66 10/28/2017   ALT 16 10/28/2017   AST 14 10/28/2017   NA 140 10/28/2017   K 4.2 10/28/2017   CL 102 10/28/2017   CREATININE 1.03 10/28/2017   BUN 12 10/28/2017   CO2 31 10/28/2017   TSH 0.96 10/28/2017   PSA 0.27 06/30/2017   HGBA1C 5.6 01/08/2015       Assessment & Plan:   Problem List Items Addressed This Visit    Anxiety    Overall improved.  Rarely takes clonazepam.  Follow.        Essential hypertension, benign    Blood pressure under good control.  Continue same medication regimen.  Follow pressures.  Follow metabolic panel.        Hypercholesterolemia    Low cholesterol diet and exercise.  Follow lipid panel.       Relevant Orders   Lipid panel   Hepatic function panel   Basic metabolic panel       Dale DurhamSCOTT, [place], MD

## 2017-11-04 NOTE — Assessment & Plan Note (Signed)
Overall improved.  Rarely takes clonazepam.  Follow.

## 2017-11-04 NOTE — Assessment & Plan Note (Signed)
Low cholesterol diet and exercise.  Follow lipid panel.   

## 2017-11-04 NOTE — Assessment & Plan Note (Signed)
Blood pressure under good control.  Continue same medication regimen.  Follow pressures.  Follow metabolic panel.   

## 2017-12-16 ENCOUNTER — Other Ambulatory Visit: Payer: Self-pay | Admitting: Internal Medicine

## 2018-01-15 ENCOUNTER — Other Ambulatory Visit: Payer: Self-pay | Admitting: Internal Medicine

## 2018-01-17 ENCOUNTER — Other Ambulatory Visit: Payer: Self-pay | Admitting: Internal Medicine

## 2018-02-12 ENCOUNTER — Other Ambulatory Visit: Payer: Self-pay | Admitting: Internal Medicine

## 2018-02-28 ENCOUNTER — Encounter: Payer: Self-pay | Admitting: Internal Medicine

## 2018-02-28 ENCOUNTER — Other Ambulatory Visit (INDEPENDENT_AMBULATORY_CARE_PROVIDER_SITE_OTHER): Payer: 59

## 2018-02-28 DIAGNOSIS — E7800 Pure hypercholesterolemia, unspecified: Secondary | ICD-10-CM

## 2018-02-28 LAB — BASIC METABOLIC PANEL
BUN: 14 mg/dL (ref 6–23)
CALCIUM: 9.3 mg/dL (ref 8.4–10.5)
CO2: 28 mEq/L (ref 19–32)
CREATININE: 1.07 mg/dL (ref 0.40–1.50)
Chloride: 103 mEq/L (ref 96–112)
GFR: 79.23 mL/min (ref >60.00)
Glucose, Bld: 104 mg/dL — ABNORMAL HIGH (ref 70–99)
Potassium: 4.1 mEq/L (ref 3.5–5.1)
Sodium: 138 mEq/L (ref 135–145)

## 2018-02-28 LAB — HEPATIC FUNCTION PANEL
ALBUMIN: 4.4 g/dL (ref 3.5–5.2)
ALT: 14 U/L (ref 0–53)
AST: 13 U/L (ref 0–37)
Alkaline Phosphatase: 64 U/L (ref 39–117)
BILIRUBIN DIRECT: 0.1 mg/dL (ref 0.0–0.3)
TOTAL PROTEIN: 6.8 g/dL (ref 6.0–8.3)
Total Bilirubin: 0.7 mg/dL (ref 0.2–1.2)

## 2018-02-28 LAB — LIPID PANEL
CHOLESTEROL: 141 mg/dL (ref 0–200)
HDL: 35.6 mg/dL — ABNORMAL LOW (ref >39.00)
LDL Cholesterol: 70 mg/dL (ref 0–99)
NonHDL: 105.58
Total CHOL/HDL Ratio: 4
Triglycerides: 180 mg/dL — ABNORMAL HIGH (ref 0.0–149.0)
VLDL: 36 mg/dL (ref 0.0–40.0)

## 2018-03-01 ENCOUNTER — Encounter: Payer: Self-pay | Admitting: Internal Medicine

## 2018-03-01 ENCOUNTER — Ambulatory Visit: Payer: 59 | Admitting: Internal Medicine

## 2018-03-01 VITALS — BP 120/84 | HR 74 | Temp 98.9°F | Resp 14 | Ht 71.0 in | Wt 201.8 lb

## 2018-03-01 DIAGNOSIS — E7800 Pure hypercholesterolemia, unspecified: Secondary | ICD-10-CM

## 2018-03-01 DIAGNOSIS — I10 Essential (primary) hypertension: Secondary | ICD-10-CM

## 2018-03-01 DIAGNOSIS — Z720 Tobacco use: Secondary | ICD-10-CM | POA: Diagnosis not present

## 2018-03-01 DIAGNOSIS — R079 Chest pain, unspecified: Secondary | ICD-10-CM | POA: Diagnosis not present

## 2018-03-01 NOTE — Progress Notes (Signed)
Patient ID: Fernando Harris, male   DOB: 12/29/1970, 47 y.o.   MRN: [id number]   Subjective:    Patient ID: Fernando Harris, male    DOB: July 06, 1971, 47 y.o.   MRN: [id number]  HPI  Patient here for a scheduled follow up.  He reports he is doing relatively well.  Is trying to start exercising.  Reports intermittently noticing some chest pressure/discomfort.  Hard to describe.  Not reproducible on exam.  No acid reflux.  States omeprazole controls symptoms.  No abdominal pain.  No sob.  Reports discomfort seems better when he walks and exercises.  Handling stress.  Not smoking cigarettes.  Is vaping occasionally.  Discussed lab results.     Past Medical History:  Diagnosis Date  . Bulging disc    evaluated [facility]  . GERD (gastroesophageal reflux disease)   . Hypertension   . Nephrolithiasis    Past Surgical History:  Procedure Laterality Date  . ELBOW SURGERY    . INGUINAL HERNIA REPAIR    . TONSILLECTOMY     Family History  Problem Relation Age of Onset  . Hypertension Father   . Diabetes Paternal Grandmother    Social History   Socioeconomic History  . Marital status: Single    Spouse name: Not on file  . Number of children: Not on file  . Years of education: Not on file  . Highest education level: Not on file  Occupational History  . Not on file  Social Needs  . Financial resource strain: Not on file  . Food insecurity:    Worry: Not on file    Inability: Not on file  . Transportation needs:    Medical: Not on file    Non-medical: Not on file  Tobacco Use  . Smoking status: Former Smoker    Packs/day: 1.00    Years: 15.00    Pack years: 15.00    Types: Cigarettes    Last attempt to quit: 04/18/2017    Years since quitting: 0.8  . Smokeless tobacco: Former Neurosurgeon    Types: Snuff    Quit date: 08/21/2013  Substance and Sexual Activity  . Alcohol use: Yes    Alcohol/week: 0.0 oz  . Drug use: No  . Sexual activity: Not on file  Lifestyle  .  Physical activity:    Days per week: Not on file    Minutes per session: Not on file  . Stress: Not on file  Relationships  . Social connections:    Talks on phone: Not on file    Gets together: Not on file    Attends religious service: Not on file    Active member of club or organization: Not on file    Attends meetings of clubs or organizations: Not on file    Relationship status: Not on file  Other Topics Concern  . Not on file  Social History Narrative  . Not on file    Outpatient Encounter Medications as of 03/01/2018  Medication Sig  . amLODipine (NORVASC) 5 MG tablet TAKE 1 TABLET BY MOUTH EVERY DAY  . clonazePAM (KLONOPIN) 0.5 MG tablet TAKE 1 TABLET BY MOUTH AT BEDTIME AS NEEDED FOR ANXIETY  . [facility] VITAMIN B12 1000 MCG tablet TAKE 1 TABLET BY MOUTH EVERY DAY  . losartan (COZAAR) 25 MG tablet TAKE 1 TABLET BY MOUTH EVERY DAY  . omeprazole (PRILOSEC) 40 MG capsule TAKE 1 CAPSULE BY MOUTH EVERY DAY  . pramoxine-hydrocortisone (ANALPRAM-HC) 1-1 % rectal  cream Place 1 application rectally 2 (two) times daily.  . rosuvastatin (CRESTOR) 10 MG tablet TAKE 1 TABLET BY MOUTH EVERY DAY   No facility-administered encounter medications on file as of 03/01/2018.     Review of Systems  Constitutional: Negative for appetite change and unexpected weight change.  HENT: Negative for congestion and sinus pressure.   Respiratory: Negative for cough and shortness of breath.   Cardiovascular: Positive for chest pain. Negative for palpitations and leg swelling.  Gastrointestinal: Negative for abdominal pain, diarrhea and nausea.  Genitourinary: Negative for difficulty urinating and dysuria.  Musculoskeletal: Negative for joint swelling and myalgias.  Skin: Negative for color change and rash.  Neurological: Negative for dizziness, light-headedness and headaches.  Psychiatric/Behavioral: Negative for agitation and dysphoric mood.       Objective:    Physical Exam  Constitutional: He appears  well-developed and well-nourished. No distress.  HENT:  Nose: Nose normal.  Mouth/Throat: Oropharynx is clear and moist.  Neck: Neck supple. No thyromegaly present.  Cardiovascular: Normal rate and regular rhythm.  Pulmonary/Chest: Effort normal and breath sounds normal. No respiratory distress.  Abdominal: Soft. Bowel sounds are normal. There is no tenderness.  Musculoskeletal: He exhibits no edema or tenderness.  Lymphadenopathy:    He has no cervical adenopathy.  Skin: No rash noted. No erythema.  Psychiatric: He has a normal mood and affect. His behavior is normal.    BP 120/84 (BP Location: Left Arm, Patient Position: Sitting, Cuff Size: Normal)   Pulse 74   Temp 98.9 F (37.2 C) (Oral)   Resp 14   Ht  (1.803 m)   Wt 201 lb 12.8 oz (91.5 kg)   SpO2 96%   BMI 28.15 kg/m  Wt Readings from Last 3 Encounters:  03/01/18 201 lb 12.8 oz (91.5 kg)  11/01/17 201 lb 12.8 oz (91.5 kg)  07/19/17 199 lb (90.3 kg)     Lab Results  Component Value Date   WBC 5.8 06/30/2017   HGB 16.4 06/30/2017   HCT 48.4 06/30/2017   PLT 218.0 06/30/2017   GLUCOSE 104 (H) 02/28/2018   CHOL 141 02/28/2018   TRIG 180.0 (H) 02/28/2018   HDL 35.60 (L) 02/28/2018   LDLDIRECT 105.0 06/07/2016   LDLCALC 70 02/28/2018   ALT 14 02/28/2018   AST 13 02/28/2018   NA 138 02/28/2018   K 4.1 02/28/2018   CL 103 02/28/2018   CREATININE 1.07 02/28/2018   BUN 14 02/28/2018   CO2 28 02/28/2018   TSH 0.96 10/28/2017   PSA 0.27 06/30/2017   HGBA1C 5.6 01/08/2015    Dg Chest 2 View  Result Date: 08/31/2015 CLINICAL DATA:  Intermittent chest pain for 2 weeks, pain radiates to RIGHT arm. Elevated hypertension. EXAM: CHEST  2 VIEW COMPARISON:  None. FINDINGS: Cardiomediastinal silhouette is normal. Linear densities LEFT lung base. The lungs are otherwise clear without pleural effusions or focal consolidations. Trachea projects midline and there is no pneumothorax. Soft tissue planes and included  osseous structures are non-suspicious. Stable mild cortical thickening and focal sclerosis LEFT posterior ninth rib. IMPRESSION: No acute cardiopulmonary process. Minimal LEFT lung base atelectasis/scarring. Electronically Signed   By: Awilda Metro M.D.   On: 08/31/2015 23:18       Assessment & Plan:   Problem List Items Addressed This Visit    Chest pain - Primary (Chronic)    Intermittent chest discomfort as outlined.  EKG - SR with no acute ischemic changes.  Discussed further cardiac w/up.Marland Kitchen  He declines.  Feels better when exercises.  Wants to monitor.  Declines further w/up.  Will be reevaluated if persistent problems.       Relevant Orders   EKG 12-Lead (Completed)   Essential hypertension, benign    Blood pressure under good control.  Continue same medication regimen.  Follow pressures.  Follow metabolic panel.        Hypercholesterolemia    On crestor.  Low cholesterol diet and exercise.  Follow lipid panel and liver function tests.        Tobacco abuse    No smoking cigarettes now.  vaping occasionally.  Discussed the need to stop.  Follow.            Dale [place], MD

## 2018-03-04 ENCOUNTER — Encounter: Payer: Self-pay | Admitting: Internal Medicine

## 2018-03-04 NOTE — Assessment & Plan Note (Signed)
Intermittent chest discomfort as outlined.  EKG - SR with no acute ischemic changes.  Discussed further cardiac w/up.Marland Kitchen  He declines.  Feels better when exercises.  Wants to monitor.  Declines further w/up.  Will be reevaluated if persistent problems.

## 2018-03-04 NOTE — Assessment & Plan Note (Signed)
No smoking cigarettes now.  vaping occasionally.  Discussed the need to stop.  Follow.

## 2018-03-04 NOTE — Assessment & Plan Note (Signed)
Blood pressure under good control.  Continue same medication regimen.  Follow pressures.  Follow metabolic panel.   

## 2018-03-04 NOTE — Assessment & Plan Note (Signed)
On crestor.  Low cholesterol diet and exercise.  Follow lipid panel and liver function tests.   

## 2018-03-17 ENCOUNTER — Other Ambulatory Visit: Payer: Self-pay | Admitting: Internal Medicine

## 2018-04-13 ENCOUNTER — Other Ambulatory Visit: Payer: Self-pay | Admitting: Internal Medicine

## 2018-06-25 ENCOUNTER — Other Ambulatory Visit: Payer: Self-pay | Admitting: Internal Medicine

## 2018-06-27 ENCOUNTER — Other Ambulatory Visit: Payer: Self-pay

## 2018-06-27 NOTE — Telephone Encounter (Signed)
Patient last seen 03/01/18 & last filled 9 months ago. Ok to refill?

## 2018-06-27 NOTE — Telephone Encounter (Signed)
rx ok'd for clonazepam #30 with no refills.   

## 2018-07-01 ENCOUNTER — Other Ambulatory Visit: Payer: Self-pay | Admitting: Internal Medicine

## 2018-07-05 ENCOUNTER — Ambulatory Visit: Payer: 59 | Admitting: Internal Medicine

## 2018-07-05 ENCOUNTER — Encounter: Payer: Self-pay | Admitting: Internal Medicine

## 2018-07-05 VITALS — BP 136/78 | HR 77 | Temp 98.4°F | Resp 18 | Wt 204.6 lb

## 2018-07-05 DIAGNOSIS — Z720 Tobacco use: Secondary | ICD-10-CM

## 2018-07-05 DIAGNOSIS — Z125 Encounter for screening for malignant neoplasm of prostate: Secondary | ICD-10-CM | POA: Diagnosis not present

## 2018-07-05 DIAGNOSIS — F419 Anxiety disorder, unspecified: Secondary | ICD-10-CM | POA: Diagnosis not present

## 2018-07-05 DIAGNOSIS — E7800 Pure hypercholesterolemia, unspecified: Secondary | ICD-10-CM | POA: Diagnosis not present

## 2018-07-05 DIAGNOSIS — I10 Essential (primary) hypertension: Secondary | ICD-10-CM

## 2018-07-05 LAB — CBC WITH DIFFERENTIAL/PLATELET
BASOS PCT: 0.7 % (ref 0.0–3.0)
Basophils Absolute: 0 10*3/uL (ref 0.0–0.1)
EOS PCT: 0.7 % (ref 0.0–5.0)
Eosinophils Absolute: 0 10*3/uL (ref 0.0–0.7)
HEMATOCRIT: 45.4 % (ref 39.0–52.0)
HEMOGLOBIN: 15.6 g/dL (ref 13.0–17.0)
LYMPHS PCT: 24.6 % (ref 12.0–46.0)
Lymphs Abs: 1.3 10*3/uL (ref 0.7–4.0)
MCHC: 34.4 g/dL (ref 30.0–36.0)
MCV: 88.2 fl (ref 78.0–100.0)
MONO ABS: 0.5 10*3/uL (ref 0.1–1.0)
Monocytes Relative: 10.3 % (ref 3.0–12.0)
Neutro Abs: 3.4 10*3/uL (ref 1.4–7.7)
Neutrophils Relative %: 63.7 % (ref 43.0–77.0)
Platelets: 230 10*3/uL (ref 150.0–400.0)
RBC: 5.15 Mil/uL (ref 4.22–5.81)
RDW: 12.9 % (ref 11.5–15.5)
WBC: 5.3 10*3/uL (ref 4.0–10.5)

## 2018-07-05 LAB — LIPID PANEL
CHOLESTEROL: 162 mg/dL (ref 0–200)
HDL: 31.1 mg/dL — ABNORMAL LOW (ref >39.00)
LDL Cholesterol: 100 mg/dL — ABNORMAL HIGH (ref 0–99)
NonHDL: 130.46
Total CHOL/HDL Ratio: 5
Triglycerides: 151 mg/dL — ABNORMAL HIGH (ref 0.0–149.0)
VLDL: 30.2 mg/dL (ref 0.0–40.0)

## 2018-07-05 LAB — HEPATIC FUNCTION PANEL
ALBUMIN: 4.7 g/dL (ref 3.5–5.2)
ALT: 15 U/L (ref 0–53)
AST: 12 U/L (ref 0–37)
Alkaline Phosphatase: 57 U/L (ref 39–117)
Bilirubin, Direct: 0.1 mg/dL (ref 0.0–0.3)
TOTAL PROTEIN: 6.6 g/dL (ref 6.0–8.3)
Total Bilirubin: 0.9 mg/dL (ref 0.2–1.2)

## 2018-07-05 LAB — BASIC METABOLIC PANEL
BUN: 12 mg/dL (ref 6–23)
CO2: 29 meq/L (ref 19–32)
Calcium: 9.6 mg/dL (ref 8.4–10.5)
Chloride: 103 mEq/L (ref 96–112)
Creatinine, Ser: 1.03 mg/dL (ref 0.40–1.50)
GFR: 82.66 mL/min (ref >60.00)
GLUCOSE: 100 mg/dL — AB (ref 70–99)
Potassium: 4.4 mEq/L (ref 3.5–5.1)
SODIUM: 139 meq/L (ref 135–145)

## 2018-07-05 LAB — PSA: PSA: 0.29 ng/mL (ref 0.10–4.00)

## 2018-07-05 NOTE — Progress Notes (Signed)
Patient ID: Fernando Harris, male   DOB: 1971/10/22, 47 y.o.   MRN: [id number]   Subjective:    Patient ID: Fernando Harris, male    DOB: Sep 22, 1971, 47 y.o.   MRN: [id number]  HPI  Patient here for a scheduled follow up.  States he is doing well.  Staying active.  No chest pain.  No sob.  Stopped crestor approximately two months ago.  No chest pain since.  Never had muscle aches.  No abdominal pain.  Bowels moving.  No acid reflux.  No nausea unless he smells something bad in the am.  Eating.  Good appetite. No vomiting.  Had some arm soreness yesterday with lifting a heavy tire.  For approximately 30-45 minutes hurt to move and was sore to palpation.  No pain after and not pain today.  Overall he feels he is doing relatively well.     Past Medical History:  Diagnosis Date  . Bulging disc    evaluated [facility]  . GERD (gastroesophageal reflux disease)   . Hypertension   . Nephrolithiasis    Past Surgical History:  Procedure Laterality Date  . ELBOW SURGERY    . INGUINAL HERNIA REPAIR    . TONSILLECTOMY     Family History  Problem Relation Age of Onset  . Hypertension Father   . Diabetes Paternal Grandmother    Social History   Socioeconomic History  . Marital status: Single    Spouse name: Not on file  . Number of children: Not on file  . Years of education: Not on file  . Highest education level: Not on file  Occupational History  . Not on file  Social Needs  . Financial resource strain: Not on file  . Food insecurity:    Worry: Not on file    Inability: Not on file  . Transportation needs:    Medical: Not on file    Non-medical: Not on file  Tobacco Use  . Smoking status: Former Smoker    Packs/day: 1.00    Years: 15.00    Pack years: 15.00    Types: Cigarettes    Last attempt to quit: 04/18/2017    Years since quitting: 1.2  . Smokeless tobacco: Former Neurosurgeon    Types: Snuff    Quit date: 08/21/2013  Substance and Sexual Activity  . Alcohol use:  Yes    Alcohol/week: 0.0 standard drinks  . Drug use: No  . Sexual activity: Not on file  Lifestyle  . Physical activity:    Days per week: Not on file    Minutes per session: Not on file  . Stress: Not on file  Relationships  . Social connections:    Talks on phone: Not on file    Gets together: Not on file    Attends religious service: Not on file    Active member of club or organization: Not on file    Attends meetings of clubs or organizations: Not on file    Relationship status: Not on file  Other Topics Concern  . Not on file  Social History Narrative  . Not on file    Outpatient Encounter Medications as of 07/05/2018  Medication Sig  . amLODipine (NORVASC) 5 MG tablet TAKE 1 TABLET BY MOUTH EVERY DAY  . clonazePAM (KLONOPIN) 0.5 MG tablet TAKE 1 TABLET BY MOUTH AT BEDTIME AS NEEDED FOR ANXIETY  . [facility] VITAMIN B12 1000 MCG tablet TAKE 1 TABLET BY MOUTH EVERY DAY  .  losartan (COZAAR) 25 MG tablet TAKE 1 TABLET BY MOUTH EVERY DAY  . omeprazole (PRILOSEC) 40 MG capsule TAKE 1 CAPSULE BY MOUTH EVERY DAY  . pramoxine-hydrocortisone (ANALPRAM-HC) 1-1 % rectal cream Place 1 application rectally 2 (two) times daily.  . [DISCONTINUED] rosuvastatin (CRESTOR) 10 MG tablet TAKE 1 TABLET BY MOUTH EVERY DAY   No facility-administered encounter medications on file as of 07/05/2018.     Review of Systems  Constitutional: Negative for appetite change and unexpected weight change.  HENT: Negative for congestion and sinus pressure.   Respiratory: Negative for cough, chest tightness and shortness of breath.   Cardiovascular: Negative for chest pain, palpitations and leg swelling.  Gastrointestinal: Negative for abdominal pain, diarrhea and nausea.  Genitourinary: Negative for difficulty urinating and dysuria.  Musculoskeletal: Negative for joint swelling and myalgias.  Skin: Negative for color change and rash.  Neurological: Negative for dizziness, light-headedness and headaches.    Psychiatric/Behavioral: Negative for agitation and dysphoric mood.       Objective:    Physical Exam  Constitutional: He appears well-developed and well-nourished. No distress.  HENT:  Nose: Nose normal.  Mouth/Throat: Oropharynx is clear and moist.  Neck: Neck supple.  Cardiovascular: Normal rate and regular rhythm.  Pulmonary/Chest: Effort normal and breath sounds normal. No respiratory distress.  Abdominal: Soft. Bowel sounds are normal. There is no tenderness.  Musculoskeletal: He exhibits no edema or tenderness.  Lymphadenopathy:    He has no cervical adenopathy.  Skin: No rash noted. No erythema.  Psychiatric: He has a normal mood and affect. His behavior is normal.    BP 136/78 (BP Location: Left Arm, Patient Position: Sitting, Cuff Size: Normal)   Pulse 77   Temp 98.4 F (36.9 C) (Oral)   Resp 18   Wt 204 lb 9.6 oz (92.8 kg)   SpO2 97%   BMI 28.54 kg/m  Wt Readings from Last 3 Encounters:  07/05/18 204 lb 9.6 oz (92.8 kg)  03/01/18 201 lb 12.8 oz (91.5 kg)  11/01/17 201 lb 12.8 oz (91.5 kg)     Lab Results  Component Value Date   WBC 5.3 07/05/2018   HGB 15.6 07/05/2018   HCT 45.4 07/05/2018   PLT 230.0 07/05/2018   GLUCOSE 100 (H) 07/05/2018   CHOL 162 07/05/2018   TRIG 151.0 (H) 07/05/2018   HDL 31.10 (L) 07/05/2018   LDLDIRECT 105.0 06/07/2016   LDLCALC 100 (H) 07/05/2018   ALT 15 07/05/2018   AST 12 07/05/2018   NA 139 07/05/2018   K 4.4 07/05/2018   CL 103 07/05/2018   CREATININE 1.03 07/05/2018   BUN 12 07/05/2018   CO2 29 07/05/2018   TSH 0.96 10/28/2017   PSA 0.29 07/05/2018   HGBA1C 5.6 01/08/2015    Dg Chest 2 View  Result Date: 08/31/2015 CLINICAL DATA:  Intermittent chest pain for 2 weeks, pain radiates to RIGHT arm. Elevated hypertension. EXAM: CHEST  2 VIEW COMPARISON:  None. FINDINGS: Cardiomediastinal silhouette is normal. Linear densities LEFT lung base. The lungs are otherwise clear without pleural effusions or focal  consolidations. Trachea projects midline and there is no pneumothorax. Soft tissue planes and included osseous structures are non-suspicious. Stable mild cortical thickening and focal sclerosis LEFT posterior ninth rib. IMPRESSION: No acute cardiopulmonary process. Minimal LEFT lung base atelectasis/scarring. Electronically Signed   By: Awilda Metro M.D.   On: 08/31/2015 23:18       Assessment & Plan:   Problem List Items Addressed This Visit  Anxiety    Overall doing well.  Takes clonazepam prn.  Follow.        Essential hypertension, benign - Primary    Blood pressure under good control.  Continue same medication regimen.  Follow pressures.  Follow metabolic panel.        Relevant Orders   CBC with Differential/Platelet (Completed)   Basic metabolic panel (Completed)   Hypercholesterolemia    Off crestor.  Did not have any symptoms with the medication.  Just stopped.  Discussed low cholesterol diet and exercise.  Follow lipid panel.        Relevant Orders   Hepatic function panel (Completed)   Lipid panel (Completed)   Tobacco abuse    Vaping.  Not smoking cigarettes.  Discussed the need to quit.         Other Visit Diagnoses    Prostate cancer screening       Relevant Orders   PSA (Completed)       Dale [place], MD

## 2018-07-07 ENCOUNTER — Other Ambulatory Visit: Payer: Self-pay | Admitting: Internal Medicine

## 2018-07-07 DIAGNOSIS — E7800 Pure hypercholesterolemia, unspecified: Secondary | ICD-10-CM

## 2018-07-07 MED ORDER — ATORVASTATIN CALCIUM 10 MG PO TABS: 10.0000 mg | ORAL_TABLET | Freq: Every day | ORAL | 2 refills | Status: DC

## 2018-07-07 NOTE — Progress Notes (Signed)
Order placed for f/u liver panel.  

## 2018-07-07 NOTE — Progress Notes (Signed)
rx sent in for lipitor.  

## 2018-07-08 ENCOUNTER — Encounter: Payer: Self-pay | Admitting: Internal Medicine

## 2018-07-08 NOTE — Assessment & Plan Note (Signed)
Overall doing well.  Takes clonazepam prn.  Follow.

## 2018-07-08 NOTE — Assessment & Plan Note (Signed)
Blood pressure under good control.  Continue same medication regimen.  Follow pressures.  Follow metabolic panel.   

## 2018-07-08 NOTE — Assessment & Plan Note (Signed)
Off crestor.  Did not have any symptoms with the medication.  Just stopped.  Discussed low cholesterol diet and exercise.  Follow lipid panel.

## 2018-07-08 NOTE — Assessment & Plan Note (Signed)
Vaping.  Not smoking cigarettes.  Discussed the need to quit.

## 2018-07-13 ENCOUNTER — Other Ambulatory Visit: Payer: Self-pay | Admitting: Internal Medicine

## 2018-08-24 ENCOUNTER — Other Ambulatory Visit (INDEPENDENT_AMBULATORY_CARE_PROVIDER_SITE_OTHER): Payer: 59

## 2018-08-24 DIAGNOSIS — E7800 Pure hypercholesterolemia, unspecified: Secondary | ICD-10-CM | POA: Diagnosis not present

## 2018-08-24 LAB — HEPATIC FUNCTION PANEL
ALBUMIN: 4.7 g/dL (ref 3.5–5.2)
ALK PHOS: 58 U/L (ref 39–117)
ALT: 19 U/L (ref 0–53)
AST: 15 U/L (ref 0–37)
BILIRUBIN TOTAL: 0.8 mg/dL (ref 0.2–1.2)
Bilirubin, Direct: 0.2 mg/dL (ref 0.0–0.3)
TOTAL PROTEIN: 6.9 g/dL (ref 6.0–8.3)

## 2018-08-25 ENCOUNTER — Encounter: Payer: Self-pay | Admitting: Internal Medicine

## 2018-09-25 ENCOUNTER — Other Ambulatory Visit: Payer: Self-pay | Admitting: Internal Medicine

## 2018-10-06 ENCOUNTER — Other Ambulatory Visit: Payer: Self-pay | Admitting: Internal Medicine

## 2018-11-07 ENCOUNTER — Encounter: Payer: Self-pay | Admitting: Internal Medicine

## 2018-11-07 ENCOUNTER — Ambulatory Visit: Payer: BLUE CROSS/BLUE SHIELD | Admitting: Internal Medicine

## 2018-11-07 VITALS — BP 124/20 | HR 89 | Temp 99.3°F | Ht 71.0 in | Wt 210.8 lb

## 2018-11-07 DIAGNOSIS — R6889 Other general symptoms and signs: Secondary | ICD-10-CM | POA: Diagnosis not present

## 2018-11-07 DIAGNOSIS — J101 Influenza due to other identified influenza virus with other respiratory manifestations: Secondary | ICD-10-CM

## 2018-11-07 LAB — POC INFLUENZA A&B (BINAX/QUICKVUE)
INFLUENZA B, POC: NEGATIVE
Influenza A, POC: POSITIVE — AB

## 2018-11-07 MED ORDER — OSELTAMIVIR PHOSPHATE 75 MG PO CAPS: 75.0000 mg | ORAL_CAPSULE | Freq: Two times a day (BID) | ORAL | 0 refills | Status: DC

## 2018-11-07 NOTE — Progress Notes (Signed)
Chief Complaint  Patient presents with  . Follow-up   Sick visit  Daughter dx'ed flu A today 48 y.o he did not get flu shot this year c/o hot today in the face, achy, congestion in chest small amts and sneezing. Nothing tried flu A + today. Also having scratchy throat     Review of Systems  Constitutional: Negative for fever.  HENT: Positive for sore throat.   Eyes: Negative for blurred vision.  Respiratory: Positive for cough.   Cardiovascular: Negative for chest pain.  Musculoskeletal: Positive for myalgias.  Skin: Negative for rash.   Past Medical History:  Diagnosis Date  . Bulging disc    evaluated [facility]  . GERD (gastroesophageal reflux disease)   . Hypertension   . Nephrolithiasis    Past Surgical History:  Procedure Laterality Date  . ELBOW SURGERY    . INGUINAL HERNIA REPAIR    . TONSILLECTOMY     Family History  Problem Relation Age of Onset  . Hypertension Father   . Diabetes Paternal Grandmother    Social History   Socioeconomic History  . Marital status: Single    Spouse name: Not on file  . Number of children: Not on file  . Years of education: Not on file  . Highest education level: Not on file  Occupational History  . Not on file  Social Needs  . Financial resource strain: Not on file  . Food insecurity:    Worry: Not on file    Inability: Not on file  . Transportation needs:    Medical: Not on file    Non-medical: Not on file  Tobacco Use  . Smoking status: Former Smoker    Packs/day: 1.00    Years: 15.00    Pack years: 15.00    Types: Cigarettes    Last attempt to quit: 04/18/2017    Years since quitting: 1.5  . Smokeless tobacco: Former Neurosurgeon    Types: Snuff    Quit date: 08/21/2013  Substance and Sexual Activity  . Alcohol use: Yes    Alcohol/week: 0.0 standard drinks  . Drug use: No  . Sexual activity: Not on file  Lifestyle  . Physical activity:    Days per week: Not on file    Minutes per session: Not on file  .  Stress: Not on file  Relationships  . Social connections:    Talks on phone: Not on file    Gets together: Not on file    Attends religious service: Not on file    Active member of club or organization: Not on file    Attends meetings of clubs or organizations: Not on file    Relationship status: Not on file  . Intimate partner violence:    Fear of current or ex partner: Not on file    Emotionally abused: Not on file    Physically abused: Not on file    Forced sexual activity: Not on file  Other Topics Concern  . Not on file  Social History Narrative   Married    Daughter    Current Meds  Medication Sig  . amLODipine (NORVASC) 5 MG tablet TAKE 1 TABLET BY MOUTH EVERY DAY  . atorvastatin (LIPITOR) 10 MG tablet TAKE 1 TABLET BY MOUTH EVERY DAY  . clonazePAM (KLONOPIN) 0.5 MG tablet TAKE 1 TABLET BY MOUTH AT BEDTIME AS NEEDED FOR ANXIETY  . [facility] VITAMIN B12 1000 MCG tablet TAKE 1 TABLET BY MOUTH EVERY DAY  .  losartan (COZAAR) 25 MG tablet TAKE 1 TABLET BY MOUTH EVERY DAY  . omeprazole (PRILOSEC) 40 MG capsule TAKE 1 CAPSULE BY MOUTH EVERY DAY  . pramoxine-hydrocortisone (ANALPRAM-HC) 1-1 % rectal cream Place 1 application rectally 2 (two) times daily.   Allergies  Allergen Reactions  . Aleve [Naproxen] Swelling    Lip swelling & mouth blisters   Recent Results (from the past 2160 hour(s))  Hepatic function panel     Status: None   Collection Time: 08/24/18  7:58 AM  Result Value Ref Range   Total Bilirubin 0.8 0.2 - 1.2 mg/dL   Bilirubin, Direct 0.2 0.0 - 0.3 mg/dL   Alkaline Phosphatase 58 39 - 117 U/L   AST 15 0 - 37 U/L   ALT 19 0 - 53 U/L   Total Protein 6.9 6.0 - 8.3 g/dL   Albumin 4.7 3.5 - 5.2 g/dL   Objective  Body mass index is 29.4 kg/m. Wt Readings from Last 3 Encounters:  11/07/18 210 lb 12.8 oz (95.6 kg)  07/05/18 204 lb 9.6 oz (92.8 kg)  03/01/18 201 lb 12.8 oz (91.5 kg)   Temp Readings from Last 3 Encounters:  11/07/18 99.3 F (37.4 C) (Oral)   07/05/18 98.4 F (36.9 C) (Oral)  03/01/18 98.9 F (37.2 C) (Oral)   BP Readings from Last 3 Encounters:  11/07/18 (!) 124/20  07/05/18 136/78  03/01/18 120/84   Pulse Readings from Last 3 Encounters:  11/07/18 89  07/05/18 77  03/01/18 74    Physical Exam Vitals signs and nursing note reviewed.  Constitutional:      Appearance: Normal appearance. He is well-developed.  HENT:     Head: Normocephalic and atraumatic.     Nose: Nose normal.     Mouth/Throat:     Mouth: Mucous membranes are moist.     Pharynx: Oropharynx is clear. Posterior oropharyngeal erythema present.  Eyes:     Conjunctiva/sclera: Conjunctivae normal.     Pupils: Pupils are equal, round, and reactive to light.  Cardiovascular:     Rate and Rhythm: Normal rate and regular rhythm.     Heart sounds: Normal heart sounds.  Pulmonary:     Effort: Pulmonary effort is normal.     Breath sounds: Normal breath sounds. No wheezing.  Skin:    General: Skin is warm and dry.  Neurological:     General: No focal deficit present.     Mental Status: He is alert and oriented to person, place, and time. Mental status is at baseline.     Gait: Gait normal.  Psychiatric:        Attention and Perception: Attention and perception normal.        Mood and Affect: Mood and affect normal.        Speech: Speech normal.        Behavior: Behavior normal. Behavior is cooperative.        Thought Content: Thought content normal.        Cognition and Memory: Cognition and memory normal.        Judgment: Judgment normal.     Assessment   1. Flu A with exposure  Plan   1. tamiflu Supportive care/cold handout  Note for work x 5 days  Call back if note better   Provider: Dr. French Ana McLean-Scoc[facility]

## 2018-11-07 NOTE — Patient Instructions (Signed)

## 2018-11-07 NOTE — Progress Notes (Signed)
Pre visit review using our clinic review tool, if applicable. No additional management support is needed unless otherwise documented below in the visit note. 

## 2018-11-13 ENCOUNTER — Encounter: Payer: Self-pay | Admitting: Internal Medicine

## 2018-11-13 ENCOUNTER — Ambulatory Visit (INDEPENDENT_AMBULATORY_CARE_PROVIDER_SITE_OTHER): Payer: BLUE CROSS/BLUE SHIELD | Admitting: Internal Medicine

## 2018-11-13 VITALS — BP 136/80 | HR 77 | Temp 99.0°F | Resp 16 | Ht 71.0 in | Wt 208.8 lb

## 2018-11-13 DIAGNOSIS — I10 Essential (primary) hypertension: Secondary | ICD-10-CM | POA: Diagnosis not present

## 2018-11-13 DIAGNOSIS — Z0000 Encounter for general adult medical examination without abnormal findings: Secondary | ICD-10-CM | POA: Diagnosis not present

## 2018-11-13 DIAGNOSIS — L989 Disorder of the skin and subcutaneous tissue, unspecified: Secondary | ICD-10-CM

## 2018-11-13 DIAGNOSIS — E7800 Pure hypercholesterolemia, unspecified: Secondary | ICD-10-CM | POA: Diagnosis not present

## 2018-11-13 LAB — BASIC METABOLIC PANEL
BUN: 8 mg/dL (ref 6–23)
CHLORIDE: 102 meq/L (ref 96–112)
CO2: 28 meq/L (ref 19–32)
CREATININE: 1.05 mg/dL (ref 0.40–1.50)
Calcium: 9.7 mg/dL (ref 8.4–10.5)
GFR: 75.95 mL/min (ref >60.00)
GLUCOSE: 103 mg/dL — AB (ref 70–99)
POTASSIUM: 4.3 meq/L (ref 3.5–5.1)
Sodium: 139 mEq/L (ref 135–145)

## 2018-11-13 LAB — HEPATIC FUNCTION PANEL
ALK PHOS: 72 U/L (ref 39–117)
ALT: 23 U/L (ref 0–53)
AST: 18 U/L (ref 0–37)
Albumin: 4.6 g/dL (ref 3.5–5.2)
BILIRUBIN DIRECT: 0.1 mg/dL (ref 0.0–0.3)
BILIRUBIN TOTAL: 0.9 mg/dL (ref 0.2–1.2)
TOTAL PROTEIN: 7.1 g/dL (ref 6.0–8.3)

## 2018-11-13 LAB — LIPID PANEL
CHOLESTEROL: 134 mg/dL (ref 0–200)
HDL: 31 mg/dL — ABNORMAL LOW (ref >39.00)
NONHDL: 102.7
Total CHOL/HDL Ratio: 4
Triglycerides: 259 mg/dL — ABNORMAL HIGH (ref 0.0–149.0)
VLDL: 51.8 mg/dL — AB (ref 0.0–40.0)

## 2018-11-13 LAB — TSH: TSH: 0.59 u[IU]/mL (ref 0.35–4.50)

## 2018-11-13 LAB — LDL CHOLESTEROL, DIRECT: Direct LDL: 69 mg/dL

## 2018-11-13 MED ORDER — MUPIROCIN 2 % EX OINT: TOPICAL_OINTMENT | CUTANEOUS | 0 refills | Status: DC

## 2018-11-13 NOTE — Assessment & Plan Note (Signed)
Physical today 11/13/18.  PSA .29 - 07/05/18.

## 2018-11-13 NOTE — Progress Notes (Signed)
Patient ID: Fernando Harris, male   DOB: February 11, 1971, 48 y.o.   MRN: [id number]   Subjective:    Patient ID: Fernando Harris, male    DOB: 1970-12-13, 48 y.o.   MRN: [id number]  HPI  Patient here for his physical exam.  He reports he is doing well.  Feels good. Trying to stay active.  No chest pain.  No sob.  No acid reflux.  No abdominal pain.  Bowels moving.  Lesion under chin.  Also back lesion.  No headache.     Past Medical History:  Diagnosis Date  . Bulging disc    evaluated [facility]  . GERD (gastroesophageal reflux disease)   . Hypertension   . Nephrolithiasis    Past Surgical History:  Procedure Laterality Date  . ELBOW SURGERY    . INGUINAL HERNIA REPAIR    . TONSILLECTOMY     Family History  Problem Relation Age of Onset  . Hypertension Father   . Diabetes Paternal Grandmother    Social History   Socioeconomic History  . Marital status: Single    Spouse name: Not on file  . Number of children: Not on file  . Years of education: Not on file  . Highest education level: Not on file  Occupational History  . Not on file  Social Needs  . Financial resource strain: Not on file  . Food insecurity:    Worry: Not on file    Inability: Not on file  . Transportation needs:    Medical: Not on file    Non-medical: Not on file  Tobacco Use  . Smoking status: Former Smoker    Packs/day: 1.00    Years: 15.00    Pack years: 15.00    Types: Cigarettes    Last attempt to quit: 04/18/2017    Years since quitting: 1.5  . Smokeless tobacco: Former Neurosurgeon    Types: Snuff    Quit date: 08/21/2013  Substance and Sexual Activity  . Alcohol use: Yes    Alcohol/week: 0.0 standard drinks  . Drug use: No  . Sexual activity: Not on file  Lifestyle  . Physical activity:    Days per week: Not on file    Minutes per session: Not on file  . Stress: Not on file  Relationships  . Social connections:    Talks on phone: Not on file    Gets together: Not on file   Attends religious service: Not on file    Active member of club or organization: Not on file    Attends meetings of clubs or organizations: Not on file    Relationship status: Not on file  Other Topics Concern  . Not on file  Social History Narrative   Married    Daughter     Outpatient Encounter Medications as of 11/13/2018  Medication Sig  . amLODipine (NORVASC) 5 MG tablet TAKE 1 TABLET BY MOUTH EVERY DAY  . atorvastatin (LIPITOR) 10 MG tablet TAKE 1 TABLET BY MOUTH EVERY DAY  . clonazePAM (KLONOPIN) 0.5 MG tablet TAKE 1 TABLET BY MOUTH AT BEDTIME AS NEEDED FOR ANXIETY  . [facility] VITAMIN B12 1000 MCG tablet TAKE 1 TABLET BY MOUTH EVERY DAY  . losartan (COZAAR) 25 MG tablet TAKE 1 TABLET BY MOUTH EVERY DAY  . omeprazole (PRILOSEC) 40 MG capsule TAKE 1 CAPSULE BY MOUTH EVERY DAY  . pramoxine-hydrocortisone (ANALPRAM-HC) 1-1 % rectal cream Place 1 application rectally 2 (two) times daily.  . mupirocin ointment (  BACTROBAN) 2 % Apply to affected area bid x 7-10 days  . [DISCONTINUED] oseltamivir (TAMIFLU) 75 MG capsule Take 1 capsule (75 mg total) by mouth 2 (two) times daily.   No facility-administered encounter medications on file as of 11/13/2018.     Review of Systems  Constitutional: Negative for appetite change and unexpected weight change.  HENT: Negative for congestion and sinus pressure.   Eyes: Negative for pain and visual disturbance.  Respiratory: Negative for cough, chest tightness and shortness of breath.   Cardiovascular: Negative for chest pain, palpitations and leg swelling.  Gastrointestinal: Negative for abdominal pain, diarrhea, nausea and vomiting.  Genitourinary: Negative for difficulty urinating and dysuria.  Musculoskeletal: Negative for back pain and joint swelling.  Skin: Negative for color change and rash.       skine lesion under chin.  Persistent back lesion.   Neurological: Negative for dizziness, light-headedness and headaches.  Hematological: Negative for  adenopathy. Does not bruise/bleed easily.  Psychiatric/Behavioral: Negative for agitation and dysphoric mood.       Objective:    Physical Exam Constitutional:      General: He is not in acute distress.    Appearance: Normal appearance. He is well-developed.  HENT:     Head: Normocephalic and atraumatic.     Nose: Nose normal. No congestion.     Mouth/Throat:     Pharynx: No oropharyngeal exudate or posterior oropharyngeal erythema.  Eyes:     General:        Right eye: No discharge.        Left eye: No discharge.     Conjunctiva/sclera: Conjunctivae normal.  Neck:     Musculoskeletal: Neck supple. No muscular tenderness.     Thyroid: No thyromegaly.  Cardiovascular:     Rate and Rhythm: Normal rate and regular rhythm.  Pulmonary:     Effort: No respiratory distress.     Breath sounds: Normal breath sounds. No wheezing.  Abdominal:     General: Bowel sounds are normal.     Palpations: Abdomen is soft.     Tenderness: There is no abdominal tenderness.  Genitourinary:    Comments: Bit performed.   Musculoskeletal:        General: No swelling or tenderness.  Lymphadenopathy:     Cervical: No cervical adenopathy.  Skin:    Findings: No erythema or rash.  Neurological:     Mental Status: He is alert and oriented to person, place, and time.  Psychiatric:        Mood and Affect: Mood normal.        Behavior: Behavior normal.     BP 136/80 (BP Location: Left Arm, Patient Position: Sitting, Cuff Size: Normal)   Pulse 77   Temp 99 F (37.2 C) (Oral)   Resp 16   Ht 5\' 11"  (1.803 m)   Wt 208 lb 12.8 oz (94.7 kg)   SpO2 97%   BMI 29.12 kg/m  Wt Readings from Last 3 Encounters:  11/13/18 208 lb 12.8 oz (94.7 kg)  11/07/18 210 lb 12.8 oz (95.6 kg)  07/05/18 204 lb 9.6 oz (92.8 kg)     Lab Results  Component Value Date   WBC 5.3 07/05/2018   HGB 15.6 07/05/2018   HCT 45.4 07/05/2018   PLT 230.0 07/05/2018   GLUCOSE 103 (H) 11/13/2018   CHOL 134 11/13/2018    TRIG 259.0 (H) 11/13/2018   HDL 31.00 (L) 11/13/2018   LDLDIRECT 69.0 11/13/2018   LDLCALC 100 (H)  07/05/2018   ALT 23 11/13/2018   AST 18 11/13/2018   NA 139 11/13/2018   K 4.3 11/13/2018   CL 102 11/13/2018   CREATININE 1.05 11/13/2018   BUN 8 11/13/2018   CO2 28 11/13/2018   TSH 0.59 11/13/2018   PSA 0.29 07/05/2018   HGBA1C 5.6 01/08/2015    Dg Chest 2 View  Result Date: 08/31/2015 CLINICAL DATA:  Intermittent chest pain for 2 weeks, pain radiates to RIGHT arm. Elevated hypertension. EXAM: CHEST  2 VIEW COMPARISON:  None. FINDINGS: Cardiomediastinal silhouette is normal. Linear densities LEFT lung base. The lungs are otherwise clear without pleural effusions or focal consolidations. Trachea projects midline and there is no pneumothorax. Soft tissue planes and included osseous structures are non-suspicious. Stable mild cortical thickening and focal sclerosis LEFT posterior ninth rib. IMPRESSION: No acute cardiopulmonary process. Minimal LEFT lung base atelectasis/scarring. Electronically Signed   By: Awilda Metro M.D.   On: 08/31/2015 23:18       Assessment & Plan:   Problem List Items Addressed This Visit    Essential hypertension, benign    Blood pressure under good control.  Continue same medication regimen.  Follow pressures.  Follow metabolic panel.        Relevant Orders   TSH (Completed)   Basic metabolic panel (Completed)   Health care maintenance    Physical today 11/13/18.  PSA .29 - 07/05/18.        Hypercholesterolemia    Low cholesterol diet and exercise.  Follow lipid panel.        Relevant Orders   Lipid panel (Completed)   Hepatic function panel (Completed)   Skin lesion of back    Persistent.  Refer to dermatology.        Relevant Orders   Ambulatory referral to Dermatology    Other Visit Diagnoses    Routine general medical examination at a health care facility    -  Primary   Skin lesion       Lesion - under chin.  Bactroban.          Dale [place], MD

## 2018-11-18 ENCOUNTER — Encounter: Payer: Self-pay | Admitting: Internal Medicine

## 2018-11-18 DIAGNOSIS — L989 Disorder of the skin and subcutaneous tissue, unspecified: Secondary | ICD-10-CM | POA: Insufficient documentation

## 2018-11-18 NOTE — Assessment & Plan Note (Signed)
Low cholesterol diet and exercise.  Follow lipid panel.   

## 2018-11-18 NOTE — Assessment & Plan Note (Signed)
Persistent.  Refer to dermatology.   

## 2018-11-18 NOTE — Assessment & Plan Note (Signed)
Blood pressure under good control.  Continue same medication regimen.  Follow pressures.  Follow metabolic panel.   

## 2018-12-30 ENCOUNTER — Other Ambulatory Visit: Payer: Self-pay | Admitting: Internal Medicine

## 2019-01-05 DIAGNOSIS — L300 Nummular dermatitis: Secondary | ICD-10-CM | POA: Diagnosis not present

## 2019-01-05 DIAGNOSIS — D225 Melanocytic nevi of trunk: Secondary | ICD-10-CM | POA: Diagnosis not present

## 2019-01-05 DIAGNOSIS — Z1283 Encounter for screening for malignant neoplasm of skin: Secondary | ICD-10-CM | POA: Diagnosis not present

## 2019-01-05 DIAGNOSIS — D485 Neoplasm of uncertain behavior of skin: Secondary | ICD-10-CM | POA: Diagnosis not present

## 2019-01-12 ENCOUNTER — Other Ambulatory Visit: Payer: Self-pay | Admitting: Internal Medicine

## 2019-01-29 ENCOUNTER — Ambulatory Visit: Payer: Self-pay | Admitting: *Deleted

## 2019-01-29 ENCOUNTER — Ambulatory Visit (INDEPENDENT_AMBULATORY_CARE_PROVIDER_SITE_OTHER): Payer: BLUE CROSS/BLUE SHIELD | Admitting: Internal Medicine

## 2019-01-29 DIAGNOSIS — R509 Fever, unspecified: Secondary | ICD-10-CM

## 2019-01-29 DIAGNOSIS — I10 Essential (primary) hypertension: Secondary | ICD-10-CM

## 2019-01-29 NOTE — Telephone Encounter (Signed)
I can do a virtual visit with him today.

## 2019-01-29 NOTE — Telephone Encounter (Signed)
Pt reports mild SOB, onset last night, none presently. States temp this AM 98.7, tympanic. "But I feel flushed." Denies any cough, reports increased fatigue today. States "Worked outside all weekend, pollen has bothered me past couple years."  Reports mild sore throat. Denies CP, no chest tightness. States "Feel congested."  Pt advised to monitor temp, limit outside interaction, self isolate if cough, fever occur, go to ED if sob worsens. Email and phone number verified. Please advise regarding appt; [phone]  Reason for Disposition . MILD difficulty breathing (e.g., minimal/no SOB at rest, SOB with walking, pulse <100)  Answer Assessment - Initial Assessment Questions 1. COVID-19 DIAGNOSIS: "Who made your Coronavirus (COVID-19) diagnosis?" "Was it confirmed by a positive lab test?" If not diagnosed by a HCP, ask "Are there lots of cases (community spread) where you live?" (See public health department website, if unsure)   * MAJOR community spread: high number of cases; numbers of cases are increasing; many people hospitalized.   * MINOR community spread: low number of cases; not increasing; few or no people hospitalized     N/A 2. ONSET: "When did the COVID-19 symptoms start?"      Last night 3. WORST SYMPTOM: "What is your worst symptom?" (e.g., cough, fever, shortness of breath, muscle aches)     SOB 4. COUGH: "How bad is the cough?"       No cough 5. FEVER: "Do you have a fever?" If so, ask: "What is your temperature, how was it measured, and when did it start?"    98.7 this AM, "Feels flushed" 6. RESPIRATORY STATUS: "Describe your breathing?" (e.g., shortness of breath, wheezing, unable to speak)      Mild SOB last night, not presently 7. BETTER-SAME-WORSE: "Are you getting better, staying the same or getting worse compared to yesterday?"  If getting worse, ask, "In what way?"    Same 8. HIGH RISK DISEASE: "Do you have any chronic medical problems?" (e.g., asthma, heart or lung  disease, weak immune system, etc.)    no 10. OTHER SYMPTOMS: "Do you have any other symptoms?"  (e.g., runny nose, headache, sore throat, loss of smell)      Mild sore throat, fatigued  Protocols used: CORONAVIRUS (COVID-19) DIAGNOSED OR SUSPECTED-A-AH

## 2019-01-29 NOTE — Telephone Encounter (Signed)
Scheduled for today.

## 2019-01-29 NOTE — Telephone Encounter (Signed)
Patient returning call stating that he now has a temperature of 101.2. Advised pt to take Tylenol to treat temperature and to self isolate. Pt states his son and his son's girlfriend have been around him and wanted to know if they should isolate as well. Pt advised that both should isolate at least 7 days and if no symptoms develop isolation may be discontinued. Understanding verbalized.Call ended at this time due to pt having a call from [place].   Reason for Disposition . 1] COVID-19 infection diagnosed or suspected AND [2] mild symptoms (fever, cough) AND [2] no trouble breathing or other complications  Protocols used: CORONAVIRUS (COVID-19) DIAGNOSED OR SUSPECTED-A-AH

## 2019-01-29 NOTE — Telephone Encounter (Signed)
Spoke with patient. He has not traveled or been around anyone who has traveled. 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 Pt says he was not running fever this morning but when he got home he was running a fever of 101.2. He is having chills, body aches, sore throat, congestion and cough. Stated he feels like he has the flu. Patient advised that he can use tylenol/ibuprofen for the fever. Offered virtual visit with him this PM or first thing in the morning. Pt was okay with either one. Patient is concerned because he has been around his wife and son and he did go to work for a little while this morning. Advised that he should self quarantine and I would be giving him a call back. Also advised if symptoms become significantly worse, he should go to the ED

## 2019-01-29 NOTE — Progress Notes (Addendum)
Patient ID: Fernando Harris, male   DOB: 09-30-1971, 48 y.o.   MRN: [id number] Virtual Visit via Video Note  I connected with Fernando Harris on 01/29/19 at  3:30 PM EDT by a video enabled telemedicine application and verified that I am speaking with the correct person using two identifiers.  Location patient: home Location provider:work  Persons participating in the virtual visit: patient, provider  I discussed the limitations of evaluation and management by telemedicine.  This visit type was conducted due to national recommendations for restrictions regarding the COVID-19 pandemic.  This format is felt to be most appropriate for this patient at this time.  The patient expressed understanding and agreed to proceed.   HPI: This was scheduled as a work in appt.  He reports that he was working out in his yard this weekend.  States he woke up this am with just not feeling well.  Took his temp - 98.7.  Went to work.  Started feeling worse, so came home.  Tmax at home today 101.6.  Took advil.  Temp now 99.3.  Reports no significant sinus pressure.  No headache.  Reports sore throat.  Was questioning the possibility of strep throat.  States may feel similar to strep.  Reports his throat is sore.  Able to swallow and eat.  Some decreased appetite, but reports no trouble swallowing.  No vomiting.  Reports last night, what he initially described as noticing harder to get his breath.  States feels some of this may have been related to anxiety and being worried.  No actual sob.  Denies throat "closing up", etc.  No progression today.  No chest pain or chest tightness.  No acid reflux reported.  No abdominal pain or diarrhea.    ROS: See pertinent positives and negatives per HPI.  Past Medical History:  Diagnosis Date  . Bulging disc    evaluated [facility]  . GERD (gastroesophageal reflux disease)   . Hypertension   . Nephrolithiasis     Past Surgical History:  Procedure Laterality Date  . ELBOW  SURGERY    . INGUINAL HERNIA REPAIR    . TONSILLECTOMY      Family History  Problem Relation Age of Onset  . Hypertension Father   . Diabetes Paternal Grandmother     SOCIAL HX: reviewed.    Current Outpatient Medications:  .  amLODipine (NORVASC) 5 MG tablet, TAKE 1 TABLET BY MOUTH EVERY DAY, Disp: 90 tablet, Rfl: 1 .  atorvastatin (LIPITOR) 10 MG tablet, TAKE 1 TABLET BY MOUTH EVERY DAY, Disp: 90 tablet, Rfl: 0 .  clonazePAM (KLONOPIN) 0.5 MG tablet, TAKE 1 TABLET BY MOUTH AT BEDTIME AS NEEDED FOR ANXIETY, Disp: 30 tablet, Rfl: 0 .  [facility] VITAMIN B12 1000 MCG tablet, TAKE 1 TABLET BY MOUTH EVERY DAY, Disp: 90 tablet, Rfl: 1 .  losartan (COZAAR) 25 MG tablet, TAKE 1 TABLET BY MOUTH EVERY DAY, Disp: 90 tablet, Rfl: 0 .  mupirocin ointment (BACTROBAN) 2 %, Apply to affected area bid x 7-10 days, Disp: 22 g, Rfl: 0 .  omeprazole (PRILOSEC) 40 MG capsule, TAKE 1 CAPSULE BY MOUTH EVERY DAY, Disp: 90 capsule, Rfl: 1 .  pramoxine-hydrocortisone (ANALPRAM-HC) 1-1 % rectal cream, Place 1 application rectally 2 (two) times daily., Disp: 30 g, Rfl: 0  EXAM:  VITALS per patient if applicable:  Tmax 101.6 and now 99.3  GENERAL: alert, oriented, appears well and in no acute distress  HEENT: atraumatic, conjunttiva clear, no obvious abnormalities on  inspection of external nose.  OP - question of minimal erythema posterior OP.   NECK: normal movements of the head and neck  LUNGS: on inspection no signs of respiratory distress, breathing rate appears normal, no obvious gross SOB, gasping or wheezing.  No cough or wheezing with forced expiration.    CV: no obvious cyanosis  PSYCH/NEURO: pleasant and cooperative, no obvious depression or anxiety, speech and thought processing grossly intact  ASSESSMENT AND PLAN:  Discussed the following assessment and plan:  Essential hypertension, benign  Febrile illness     I discussed the assessment and treatment plan with the patient. The patient  was provided an opportunity to ask questions and all were answered. The patient agreed with the plan and demonstrated an understanding of the instructions.   The patient was advised to call back or seek an in-person evaluation if the symptoms worsen or if the condition fails to improve as anticipated.  I provided 25 minutes of non-face-to-face time during this encounter.   Dale [place], MD

## 2019-01-30 ENCOUNTER — Telehealth: Payer: Self-pay

## 2019-01-30 ENCOUNTER — Encounter: Payer: Self-pay | Admitting: Internal Medicine

## 2019-01-30 DIAGNOSIS — R509 Fever, unspecified: Secondary | ICD-10-CM | POA: Insufficient documentation

## 2019-01-30 MED ORDER — AMOXICILLIN 500 MG PO CAPS: 500.0000 mg | ORAL_CAPSULE | Freq: Three times a day (TID) | ORAL | 0 refills | Status: DC

## 2019-01-30 NOTE — Telephone Encounter (Signed)
Called patient for update. He says he is feeling some better. No fever as of 1:00 AM. Has not used Tylenol since then. Throat is still sore and swollen. He had his wife look at it this morning. He is still self quarantined and resting, drinking plenty of fluids, etc. He has had strep throat in the past and says that he feels that may be what he has. Advised that I would send message over to Dr. Lorin Picket and give him a call this afternoon

## 2019-01-30 NOTE — Telephone Encounter (Signed)
Patient is aware of how to take abx and probiotic. I have sent in to [facility] as requested. Pt gave understanding to still self isolate as discussed and will call with update

## 2019-01-30 NOTE — Assessment & Plan Note (Signed)
Acute febrile illness.  Discussed at length with him today.  No known definite COVID exposures.  Discussed possible COVID - given presentation.  Has had flu a couple of months ago.  Discussed possible strep.  Discussed treating fever with tylenol.  Avoid antiinflammatories.  Discussed staying hydrated and rest.  Discussed self quarantine for him and his family.  We went through the specifics of being quarantined.  Discussed not returning to work.  Hold abx today.  Will call with update tomorrow. Any worsening change or problems, he is to be evaluated.  Questions answered.

## 2019-01-30 NOTE — Assessment & Plan Note (Signed)
Discussed importance of spot checking his pressures.

## 2019-01-30 NOTE — Telephone Encounter (Signed)
Given changes in throat, I will send in abx.  Confirm not allergic to pcn or amoxicillin.  If no, then send in amoxicillin 500mg  tid x 10 days.  Take a probiotic daily while on abx and for two weeks after completing the abx.  Continue to gargle with salt water.  Continue isolation as we discussed yesterday.  Call with update and keep [place] posted on how he is doing.

## 2019-02-01 ENCOUNTER — Telehealth: Payer: Self-pay

## 2019-02-01 NOTE — Telephone Encounter (Signed)
Called pt to f/u and see how he was feeling. He is feeling much better. Advised to let us know if he needs anything.

## 2019-03-16 ENCOUNTER — Ambulatory Visit (INDEPENDENT_AMBULATORY_CARE_PROVIDER_SITE_OTHER): Payer: BLUE CROSS/BLUE SHIELD | Admitting: Internal Medicine

## 2019-03-16 ENCOUNTER — Encounter: Payer: Self-pay | Admitting: Internal Medicine

## 2019-03-16 ENCOUNTER — Other Ambulatory Visit: Payer: Self-pay

## 2019-03-16 DIAGNOSIS — E7800 Pure hypercholesterolemia, unspecified: Secondary | ICD-10-CM

## 2019-03-16 DIAGNOSIS — I10 Essential (primary) hypertension: Secondary | ICD-10-CM | POA: Diagnosis not present

## 2019-03-16 NOTE — Progress Notes (Signed)
Patient ID: Fernando Harris, male   DOB: [birth year], 48 y.o.   MRN: [id number]   Virtual Visit via video Note  This visit type was conducted due to national recommendations for restrictions regarding the COVID-19 pandemic (e.g. social distancing).  This format is felt to be most appropriate for this patient at this time.  All issues noted in this document were discussed and addressed.  No physical exam was performed (except for noted visual exam findings with Video Visits).   I connected with Fernando Harris by a video enabled telemedicine application or telephone and verified that I am speaking with the correct person using two identifiers. Location patient: home Location provider: work Persons participating in the virtual visit: patient, provider  I discussed the limitations, risks, security and privacy concerns of performing an evaluation and management service by video and the availability of in person appointments.  The patient expressed understanding and agreed to proceed.  Interactive audio and video telecommunications were attempted between this provider and patient and were successful initially and through most of the visit.  Due to increased static, we had to change to a telephone visit.  We continued and completed visit with audio only.  Pt was in agreement.    Reason for visit: scheduled follow up.   HPI: He reports he is doing well.  Feels good.  Previously treated for respiratory infection.  See last note.  Concern initially over possible COVID.  Called back concerned regarding strep.  Treated.  Symptoms resolved. Feels good now.  No one else sick in the house.  Working.  Practicing social distancing.  No fever.  No cough or congestion.  No acid reflux.  No abdominal pain.  Bowels moving.     ROS: See pertinent positives and negatives per HPI.  Past Medical History:  Diagnosis Date  . Bulging disc    evaluated [facility]  . GERD (gastroesophageal reflux disease)   .  Hypertension   . Nephrolithiasis     Past Surgical History:  Procedure Laterality Date  . ELBOW SURGERY    . INGUINAL HERNIA REPAIR    . TONSILLECTOMY      Family History  Problem Relation Age of Onset  . Hypertension Father   . Diabetes Paternal Grandmother     SOCIAL HX: reviewed.    Current Outpatient Medications:  .  amLODipine (NORVASC) 5 MG tablet, TAKE 1 TABLET BY MOUTH EVERY DAY, Disp: 90 tablet, Rfl: 1 .  amoxicillin (AMOXIL) 500 MG capsule, Take 1 capsule (500 mg total) by mouth 3 (three) times daily., Disp: 30 capsule, Rfl: 0 .  atorvastatin (LIPITOR) 10 MG tablet, TAKE 1 TABLET BY MOUTH EVERY DAY, Disp: 90 tablet, Rfl: 0 .  clonazePAM (KLONOPIN) 0.5 MG tablet, TAKE 1 TABLET BY MOUTH AT BEDTIME AS NEEDED FOR ANXIETY, Disp: 30 tablet, Rfl: 0 .  [employer] VITAMIN B12 1000 MCG tablet, TAKE 1 TABLET BY MOUTH EVERY DAY, Disp: 90 tablet, Rfl: 1 .  losartan (COZAAR) 25 MG tablet, TAKE 1 TABLET BY MOUTH EVERY DAY, Disp: 90 tablet, Rfl: 0 .  mupirocin ointment (BACTROBAN) 2 %, Apply to affected area bid x 7-10 days, Disp: 22 g, Rfl: 0 .  omeprazole (PRILOSEC) 40 MG capsule, TAKE 1 CAPSULE BY MOUTH EVERY DAY, Disp: 90 capsule, Rfl: 1 .  pramoxine-hydrocortisone (ANALPRAM-HC) 1-1 % rectal cream, Place 1 application rectally 2 (two) times daily., Disp: 30 g, Rfl: 0  EXAM:  GENERAL: alert, oriented, appears well and in no acute distress  HEENT: atraumatic, conjunttiva clear, no obvious abnormalities on inspection of external nose and ears  NECK: normal movements of the head and neck  LUNGS: on inspection no signs of respiratory distress, breathing rate appears normal, no obvious gross SOB, gasping or wheezing  CV: no obvious cyanosis  PSYCH/NEURO: pleasant and cooperative, no obvious depression or anxiety, speech and thought processing grossly intact  ASSESSMENT AND PLAN:  Discussed the following assessment and plan:  Essential hypertension, benign - Plan: Basic metabolic  panel  Hypercholesterolemia - Plan: Hepatic function panel, Lipid panel  Essential hypertension, benign States blood pressure has been doing well.  Continue current medication regimen.  Follow pressures.  Follow metabolic panel.    Hypercholesterolemia Low cholesterol diet and exercise.  On lipitor.  Follow lipid panel and liver function tests.      I discussed the assessment and treatment plan with the patient. The patient was provided an opportunity to ask questions and all were answered. The patient agreed with the plan and demonstrated an understanding of the instructions.   The patient was advised to call back or seek an in-person evaluation if the symptoms worsen or if the condition fails to improve as anticipated.  I provided 15 minutes of non-face-to-face time during this encounter.   Dale Durhamharlene Annakate Soulier, MD

## 2019-03-18 ENCOUNTER — Encounter: Payer: Self-pay | Admitting: Internal Medicine

## 2019-03-18 NOTE — Assessment & Plan Note (Signed)
Low cholesterol diet and exercise.  On lipitor.  Follow lipid panel and liver function tests.   

## 2019-03-18 NOTE — Assessment & Plan Note (Signed)
States blood pressure has been doing well.  Continue current medication regimen.  Follow pressures.  Follow metabolic panel.  

## 2019-03-29 ENCOUNTER — Other Ambulatory Visit: Payer: Self-pay | Admitting: Internal Medicine

## 2019-04-07 ENCOUNTER — Other Ambulatory Visit: Payer: Self-pay | Admitting: Internal Medicine

## 2019-07-07 ENCOUNTER — Other Ambulatory Visit: Payer: Self-pay | Admitting: Internal Medicine

## 2019-08-12 ENCOUNTER — Other Ambulatory Visit: Payer: Self-pay | Admitting: Internal Medicine

## 2019-08-24 ENCOUNTER — Other Ambulatory Visit: Payer: Self-pay

## 2019-08-28 ENCOUNTER — Other Ambulatory Visit: Payer: Self-pay

## 2019-08-28 ENCOUNTER — Ambulatory Visit (INDEPENDENT_AMBULATORY_CARE_PROVIDER_SITE_OTHER): Payer: BC Managed Care – PPO | Admitting: Internal Medicine

## 2019-08-28 ENCOUNTER — Encounter: Payer: Self-pay | Admitting: Internal Medicine

## 2019-08-28 VITALS — BP 124/84 | HR 70 | Temp 97.8°F | Resp 16 | Ht 70.0 in | Wt 219.4 lb

## 2019-08-28 DIAGNOSIS — Z0000 Encounter for general adult medical examination without abnormal findings: Secondary | ICD-10-CM | POA: Diagnosis not present

## 2019-08-28 DIAGNOSIS — Z23 Encounter for immunization: Secondary | ICD-10-CM | POA: Diagnosis not present

## 2019-08-28 DIAGNOSIS — I10 Essential (primary) hypertension: Secondary | ICD-10-CM | POA: Diagnosis not present

## 2019-08-28 DIAGNOSIS — R350 Frequency of micturition: Secondary | ICD-10-CM | POA: Diagnosis not present

## 2019-08-28 DIAGNOSIS — E7800 Pure hypercholesterolemia, unspecified: Secondary | ICD-10-CM | POA: Diagnosis not present

## 2019-08-28 DIAGNOSIS — Z125 Encounter for screening for malignant neoplasm of prostate: Secondary | ICD-10-CM | POA: Diagnosis not present

## 2019-08-28 LAB — URINALYSIS, ROUTINE W REFLEX MICROSCOPIC
Bilirubin Urine: NEGATIVE
Hgb urine dipstick: NEGATIVE
Ketones, ur: NEGATIVE
Leukocytes,Ua: NEGATIVE
Nitrite: NEGATIVE
RBC / HPF: NONE SEEN (ref 0–?)
Specific Gravity, Urine: 1.015 (ref 1.000–1.030)
Total Protein, Urine: NEGATIVE
Urine Glucose: NEGATIVE
Urobilinogen, UA: 0.2 (ref 0.0–1.0)
WBC, UA: NONE SEEN (ref 0–?)
pH: 8 (ref 5.0–8.0)

## 2019-08-28 LAB — BASIC METABOLIC PANEL
BUN: 10 mg/dL (ref 6–23)
CO2: 31 mEq/L (ref 19–32)
Calcium: 9.3 mg/dL (ref 8.4–10.5)
Chloride: 101 mEq/L (ref 96–112)
Creatinine, Ser: 1.03 mg/dL (ref 0.40–1.50)
GFR: 77.39 mL/min (ref >60.00)
Glucose, Bld: 110 mg/dL — ABNORMAL HIGH (ref 70–99)
Potassium: 4.4 mEq/L (ref 3.5–5.1)
Sodium: 138 mEq/L (ref 135–145)

## 2019-08-28 LAB — CBC WITH DIFFERENTIAL/PLATELET
Basophils Absolute: 0 10*3/uL (ref 0.0–0.1)
Basophils Relative: 0.5 % (ref 0.0–3.0)
Eosinophils Absolute: 0.1 10*3/uL (ref 0.0–0.7)
Eosinophils Relative: 1.9 % (ref 0.0–5.0)
HCT: 43.6 % (ref 39.0–52.0)
Hemoglobin: 14.9 g/dL (ref 13.0–17.0)
Lymphocytes Relative: 21.5 % (ref 12.0–46.0)
Lymphs Abs: 1.2 10*3/uL (ref 0.7–4.0)
MCHC: 34.2 g/dL (ref 30.0–36.0)
MCV: 87.6 fl (ref 78.0–100.0)
Monocytes Absolute: 0.5 10*3/uL (ref 0.1–1.0)
Monocytes Relative: 9.3 % (ref 3.0–12.0)
Neutro Abs: 3.6 10*3/uL (ref 1.4–7.7)
Neutrophils Relative %: 66.8 % (ref 43.0–77.0)
Platelets: 226 10*3/uL (ref 150.0–400.0)
RBC: 4.98 Mil/uL (ref 4.22–5.81)
RDW: 12.8 % (ref 11.5–15.5)
WBC: 5.4 10*3/uL (ref 4.0–10.5)

## 2019-08-28 LAB — LIPID PANEL
Cholesterol: 162 mg/dL (ref 0–200)
HDL: 37.8 mg/dL — ABNORMAL LOW (ref >39.00)
NonHDL: 123.97
Total CHOL/HDL Ratio: 4
Triglycerides: 205 mg/dL — ABNORMAL HIGH (ref 0.0–149.0)
VLDL: 41 mg/dL — ABNORMAL HIGH (ref 0.0–40.0)

## 2019-08-28 LAB — HEPATIC FUNCTION PANEL
ALT: 22 U/L (ref 0–53)
AST: 15 U/L (ref 0–37)
Albumin: 4.6 g/dL (ref 3.5–5.2)
Alkaline Phosphatase: 73 U/L (ref 39–117)
Bilirubin, Direct: 0.1 mg/dL (ref 0.0–0.3)
Total Bilirubin: 0.7 mg/dL (ref 0.2–1.2)
Total Protein: 6.4 g/dL (ref 6.0–8.3)

## 2019-08-28 LAB — PSA: PSA: 0.23 ng/mL (ref 0.10–4.00)

## 2019-08-28 LAB — LDL CHOLESTEROL, DIRECT: Direct LDL: 90 mg/dL

## 2019-08-28 LAB — TSH: TSH: 0.62 u[IU]/mL (ref 0.35–4.50)

## 2019-08-28 NOTE — Assessment & Plan Note (Addendum)
Physical today 08/28/19.  Check psa today.  Will need to discuss colon cancer screening.

## 2019-08-28 NOTE — Assessment & Plan Note (Signed)
Blood pressure under good control.  Continue same medication regimen.  Follow pressures.  Follow metabolic panel.   

## 2019-08-28 NOTE — Progress Notes (Signed)
Patient ID: Fernando ReelBrian A Kotz, male   DOB: [birth year], 48 y.o.   MRN: [id number]   Subjective:    Patient ID: Fernando ReelBrian A Armor, male    DOB: [birth year], 48 y.o.   MRN: [id number]  HPI  Patient here for his physical exam.  He reports he is doing well.  Working.  No chest pain.  No sob.  No acid reflux.  No abdominal pain.  Has noticed slight increased urinary frequency.  No dysuria.  Bowels stable.  Increased stress with covid and work.  Overall handling things well.    Past Medical History:  Diagnosis Date  . Bulging disc    evaluated [facility]  . GERD (gastroesophageal reflux disease)   . Hypertension   . Nephrolithiasis    Past Surgical History:  Procedure Laterality Date  . ELBOW SURGERY    . INGUINAL HERNIA REPAIR    . TONSILLECTOMY     Family History  Problem Relation Age of Onset  . Hypertension Father   . Diabetes Paternal Grandmother    Social History   Socioeconomic History  . Marital status: Married    Spouse name: Not on file  . Number of children: Not on file  . Years of education: Not on file  . Highest education level: Not on file  Occupational History  . Not on file  Social Needs  . Financial resource strain: Not on file  . Food insecurity    Worry: Not on file    Inability: Not on file  . Transportation needs    Medical: Not on file    Non-medical: Not on file  Tobacco Use  . Smoking status: Former Smoker    Packs/day: 1.00    Years: 15.00    Pack years: 15.00    Types: Cigarettes    Quit date: 04/18/2017    Years since quitting: 2.3  . Smokeless tobacco: Former NeurosurgeonUser    Types: Snuff    Quit date: 08/21/2013  Substance and Sexual Activity  . Alcohol use: Yes    Alcohol/week: 0.0 standard drinks  . Drug use: No  . Sexual activity: Not on file  Lifestyle  . Physical activity    Days per week: Not on file    Minutes per session: Not on file  . Stress: Not on file  Relationships  . Social Musicianconnections    Talks on phone: Not on file     Gets together: Not on file    Attends religious service: Not on file    Active member of club or organization: Not on file    Attends meetings of clubs or organizations: Not on file    Relationship status: Not on file  Other Topics Concern  . Not on file  Social History Narrative   Married    Daughter     Outpatient Encounter Medications as of 08/28/2019  Medication Sig  . amLODipine (NORVASC) 5 MG tablet TAKE 1 TABLET BY MOUTH EVERY DAY  . atorvastatin (LIPITOR) 10 MG tablet TAKE 1 TABLET BY MOUTH EVERY DAY  . clonazePAM (KLONOPIN) 0.5 MG tablet TAKE 1 TABLET BY MOUTH AT BEDTIME AS NEEDED FOR ANXIETY  . [employer]S VITAMIN B12 1000 MCG tablet TAKE 1 TABLET BY MOUTH EVERY DAY  . losartan (COZAAR) 25 MG tablet TAKE 1 TABLET BY MOUTH EVERY DAY  . omeprazole (PRILOSEC) 40 MG capsule TAKE 1 CAPSULE BY MOUTH EVERY DAY  . pramoxine-hydrocortisone (ANALPRAM-HC) 1-1 % rectal cream Place 1 application rectally 2 (two) times  daily.  . [DISCONTINUED] amoxicillin (AMOXIL) 500 MG capsule Take 1 capsule (500 mg total) by mouth 3 (three) times daily.  . [DISCONTINUED] mupirocin ointment (BACTROBAN) 2 % Apply to affected area bid x 7-10 days   No facility-administered encounter medications on file as of 08/28/2019.    Review of Systems  Constitutional: Negative for appetite change and unexpected weight change.  HENT: Negative for congestion and sinus pressure.   Eyes: Negative for pain and visual disturbance.  Respiratory: Negative for cough, chest tightness and shortness of breath.   Cardiovascular: Negative for chest pain, palpitations and leg swelling.  Gastrointestinal: Negative for abdominal pain, diarrhea, nausea and vomiting.  Genitourinary: Positive for frequency. Negative for difficulty urinating and dysuria.  Musculoskeletal: Negative for joint swelling and myalgias.  Skin: Negative for color change and rash.  Neurological: Negative for dizziness, light-headedness and headaches.   Hematological: Negative for adenopathy. Does not bruise/bleed easily.  Psychiatric/Behavioral: Negative for agitation and dysphoric mood.       Objective:    Physical Exam Constitutional:      General: He is not in acute distress.    Appearance: Normal appearance. He is well-developed.  HENT:     Head: Normocephalic and atraumatic.     Right Ear: External ear normal.     Left Ear: External ear normal.     Mouth/Throat:     Pharynx: No oropharyngeal exudate.  Eyes:     General: No scleral icterus.       Right eye: No discharge.        Left eye: No discharge.     Conjunctiva/sclera: Conjunctivae normal.  Neck:     Musculoskeletal: Neck supple. No muscular tenderness.     Thyroid: No thyromegaly.  Cardiovascular:     Rate and Rhythm: Normal rate and regular rhythm.  Pulmonary:     Effort: No respiratory distress.     Breath sounds: Normal breath sounds. No wheezing.  Abdominal:     General: Bowel sounds are normal.     Palpations: Abdomen is soft.     Tenderness: There is no abdominal tenderness.  Genitourinary:    Comments: Not performed.  Musculoskeletal:        General: No swelling or tenderness.  Lymphadenopathy:     Cervical: No cervical adenopathy.  Skin:    Findings: No erythema or rash.  Neurological:     Mental Status: He is alert and oriented to person, place, and time.  Psychiatric:        Mood and Affect: Mood normal.        Behavior: Behavior normal.     BP 124/84   Pulse 70   Temp 97.8 F (36.6 C)   Resp 16   Ht 5\' 10"  (1.778 m)   Wt 219 lb 6.4 oz (99.5 kg)   SpO2 97%   BMI 31.48 kg/m  Wt Readings from Last 3 Encounters:  08/28/19 219 lb 6.4 oz (99.5 kg)  11/13/18 208 lb 12.8 oz (94.7 kg)  11/07/18 210 lb 12.8 oz (95.6 kg)     Lab Results  Component Value Date   WBC 5.4 08/28/2019   HGB 14.9 08/28/2019   HCT 43.6 08/28/2019   PLT 226.0 08/28/2019   GLUCOSE 110 (H) 08/28/2019   CHOL 162 08/28/2019   TRIG 205.0 (H) 08/28/2019   HDL  37.80 (L) 08/28/2019   LDLDIRECT 90.0 08/28/2019   LDLCALC 100 (H) 07/05/2018   ALT 22 08/28/2019   AST 15 08/28/2019  NA 138 08/28/2019   K 4.4 08/28/2019   CL 101 08/28/2019   CREATININE 1.03 08/28/2019   BUN 10 08/28/2019   CO2 31 08/28/2019   TSH 0.62 08/28/2019   PSA 0.23 08/28/2019   HGBA1C 5.6 01/08/2015    Dg Chest 2 View  Result Date: 08/31/2015 CLINICAL DATA:  Intermittent chest pain for 2 weeks, pain radiates to RIGHT arm. Elevated hypertension. EXAM: CHEST  2 VIEW COMPARISON:  None. FINDINGS: Cardiomediastinal silhouette is normal. Linear densities LEFT lung base. The lungs are otherwise clear without pleural effusions or focal consolidations. Trachea projects midline and there is no pneumothorax. Soft tissue planes and included osseous structures are non-suspicious. Stable mild cortical thickening and focal sclerosis LEFT posterior ninth rib. IMPRESSION: No acute cardiopulmonary process. Minimal LEFT lung base atelectasis/scarring. Electronically Signed   By: Elon Alas M.D.   On: 08/31/2015 23:18       Assessment & Plan:   Problem List Items Addressed This Visit    Essential hypertension, benign    Blood pressure under good control.  Continue same medication regimen.  Follow pressures.  Follow metabolic panel.        Relevant Orders   CBC with Differential/Platelet (Completed)   TSH (Completed)   Basic metabolic panel (Completed)   Health care maintenance    Physical today 08/28/19.  Check psa today.  Will need to discuss colon cancer screening.        Hypercholesterolemia    On lipitor.  Low cholesterol diet and exercise.  Follow lipid panel and liver function tests.        Relevant Orders   Hepatic function panel (Completed)   Lipid panel (Completed)   Urinary frequency    Not a significant problem for him.  Check labs and confirm sugar ok.  Check urine.  Follow.        Relevant Orders   Urinalysis, Routine w reflex microscopic (Completed)     Other Visit Diagnoses    Prostate cancer screening    -  Primary   Relevant Orders   PSA (Completed)   Need for immunization against influenza       Relevant Orders   Flu Vaccine QUAD 36+ mos IM (Completed)       Einar Pheasant, MD

## 2019-08-28 NOTE — Assessment & Plan Note (Signed)
On lipitor.  Low cholesterol diet and exercise.  Follow lipid panel and liver function tests.   

## 2019-09-02 ENCOUNTER — Encounter: Payer: Self-pay | Admitting: Internal Medicine

## 2019-09-02 NOTE — Assessment & Plan Note (Signed)
Not a significant problem for him.  Check labs and confirm sugar ok.  Check urine.  Follow.

## 2019-09-14 DIAGNOSIS — Z20828 Contact with and (suspected) exposure to other viral communicable diseases: Secondary | ICD-10-CM | POA: Diagnosis not present

## 2019-10-03 ENCOUNTER — Other Ambulatory Visit: Payer: Self-pay | Admitting: Internal Medicine

## 2019-10-03 DIAGNOSIS — R739 Hyperglycemia, unspecified: Secondary | ICD-10-CM

## 2019-10-03 NOTE — Progress Notes (Signed)
Order placed for f/u fasting labs.  

## 2019-10-04 ENCOUNTER — Encounter: Payer: Self-pay | Admitting: Internal Medicine

## 2019-10-04 ENCOUNTER — Other Ambulatory Visit (INDEPENDENT_AMBULATORY_CARE_PROVIDER_SITE_OTHER): Payer: BC Managed Care – PPO

## 2019-10-04 ENCOUNTER — Other Ambulatory Visit: Payer: Self-pay

## 2019-10-04 DIAGNOSIS — E7800 Pure hypercholesterolemia, unspecified: Secondary | ICD-10-CM | POA: Diagnosis not present

## 2019-10-04 DIAGNOSIS — R739 Hyperglycemia, unspecified: Secondary | ICD-10-CM

## 2019-10-04 DIAGNOSIS — I10 Essential (primary) hypertension: Secondary | ICD-10-CM

## 2019-10-04 LAB — BASIC METABOLIC PANEL
BUN: 11 mg/dL (ref 6–23)
CO2: 28 mEq/L (ref 19–32)
Calcium: 9.2 mg/dL (ref 8.4–10.5)
Chloride: 104 mEq/L (ref 96–112)
Creatinine, Ser: 1 mg/dL (ref 0.40–1.50)
GFR: 80.04 mL/min (ref >60.00)
Glucose, Bld: 102 mg/dL — ABNORMAL HIGH (ref 70–99)
Potassium: 4.1 mEq/L (ref 3.5–5.1)
Sodium: 138 mEq/L (ref 135–145)

## 2019-10-04 LAB — HEPATIC FUNCTION PANEL
ALT: 19 U/L (ref 0–53)
AST: 14 U/L (ref 0–37)
Albumin: 4.5 g/dL (ref 3.5–5.2)
Alkaline Phosphatase: 67 U/L (ref 39–117)
Bilirubin, Direct: 0.1 mg/dL (ref 0.0–0.3)
Total Bilirubin: 0.6 mg/dL (ref 0.2–1.2)
Total Protein: 6.7 g/dL (ref 6.0–8.3)

## 2019-10-04 LAB — LIPID PANEL
Cholesterol: 133 mg/dL (ref 0–200)
HDL: 33.2 mg/dL — ABNORMAL LOW (ref >39.00)
LDL Cholesterol: 67 mg/dL (ref 0–99)
NonHDL: 99.76
Total CHOL/HDL Ratio: 4
Triglycerides: 162 mg/dL — ABNORMAL HIGH (ref 0.0–149.0)
VLDL: 32.4 mg/dL (ref 0.0–40.0)

## 2019-10-04 LAB — HEMOGLOBIN A1C: Hgb A1c MFr Bld: 5.5 % (ref 4.6–6.5)

## 2019-10-05 LAB — GLUCOSE, FASTING: Glucose, Plasma: 99 mg/dL (ref 65–99)

## 2019-10-09 ENCOUNTER — Other Ambulatory Visit: Payer: Self-pay | Admitting: Internal Medicine

## 2019-10-09 NOTE — Telephone Encounter (Signed)
Please call pt and notify him that we received a request for refill of clonazepam.  Thought he was not taking now.  Has not had refilled since 2019.  Please clarify with pt.

## 2019-10-10 ENCOUNTER — Other Ambulatory Visit: Payer: Self-pay | Admitting: Internal Medicine

## 2019-10-10 MED ORDER — CLONAZEPAM 0.5 MG PO TABS: 0.5000 mg | ORAL_TABLET | Freq: Every evening | ORAL | 0 refills | Status: DC | PRN

## 2019-10-10 NOTE — Telephone Encounter (Signed)
See refill message in chart from yesterday.   Has not refilled since 2019.  Thought he was not taking.  Please call and see if still taking.

## 2019-10-10 NOTE — Telephone Encounter (Signed)
rx sent in for clonazepam.   

## 2019-10-10 NOTE — Telephone Encounter (Signed)
Refilled: 06/27/2018 Last OV: 08/28/2019 Next OV: 02/28/2020

## 2019-10-10 NOTE — Telephone Encounter (Signed)
rx sent in for clonazepam #30 with no refills.

## 2019-11-21 DIAGNOSIS — U071 COVID-19: Secondary | ICD-10-CM | POA: Diagnosis not present

## 2019-12-18 ENCOUNTER — Other Ambulatory Visit: Payer: Self-pay | Admitting: Internal Medicine

## 2019-12-19 ENCOUNTER — Other Ambulatory Visit: Payer: Self-pay | Admitting: Internal Medicine

## 2019-12-19 NOTE — Telephone Encounter (Signed)
Refill request for Klonopin, last seen 10-03-19, last filled 10-10-19.  Please advise.

## 2019-12-19 NOTE — Telephone Encounter (Signed)
rx ok'd for clonazepam #30 with no refills.   

## 2020-01-07 DIAGNOSIS — Z23 Encounter for immunization: Secondary | ICD-10-CM | POA: Diagnosis not present

## 2020-01-16 ENCOUNTER — Other Ambulatory Visit: Payer: Self-pay | Admitting: Internal Medicine

## 2020-02-04 DIAGNOSIS — Z23 Encounter for immunization: Secondary | ICD-10-CM | POA: Diagnosis not present

## 2020-02-07 ENCOUNTER — Other Ambulatory Visit: Payer: Self-pay | Admitting: Internal Medicine

## 2020-02-28 ENCOUNTER — Ambulatory Visit: Payer: BC Managed Care – PPO | Admitting: Internal Medicine

## 2020-02-28 ENCOUNTER — Other Ambulatory Visit: Payer: Self-pay

## 2020-02-28 ENCOUNTER — Encounter: Payer: Self-pay | Admitting: Internal Medicine

## 2020-02-28 VITALS — BP 138/80 | HR 84 | Temp 97.8°F | Resp 16 | Ht 70.0 in | Wt 214.2 lb

## 2020-02-28 DIAGNOSIS — I10 Essential (primary) hypertension: Secondary | ICD-10-CM | POA: Diagnosis not present

## 2020-02-28 DIAGNOSIS — E7800 Pure hypercholesterolemia, unspecified: Secondary | ICD-10-CM

## 2020-02-28 DIAGNOSIS — Z720 Tobacco use: Secondary | ICD-10-CM

## 2020-02-28 DIAGNOSIS — M542 Cervicalgia: Secondary | ICD-10-CM

## 2020-02-28 DIAGNOSIS — R000 Tachycardia, unspecified: Secondary | ICD-10-CM | POA: Insufficient documentation

## 2020-02-28 DIAGNOSIS — M79601 Pain in right arm: Secondary | ICD-10-CM

## 2020-02-28 DIAGNOSIS — R739 Hyperglycemia, unspecified: Secondary | ICD-10-CM | POA: Insufficient documentation

## 2020-02-28 LAB — BASIC METABOLIC PANEL
BUN: 12 mg/dL (ref 6–23)
CO2: 30 mEq/L (ref 19–32)
Calcium: 9.4 mg/dL (ref 8.4–10.5)
Chloride: 103 mEq/L (ref 96–112)
Creatinine, Ser: 1.03 mg/dL (ref 0.40–1.50)
GFR: 77.22 mL/min (ref >60.00)
Glucose, Bld: 109 mg/dL — ABNORMAL HIGH (ref 70–99)
Potassium: 4.7 mEq/L (ref 3.5–5.1)
Sodium: 138 mEq/L (ref 135–145)

## 2020-02-28 LAB — CBC WITH DIFFERENTIAL/PLATELET
Basophils Absolute: 0 10*3/uL (ref 0.0–0.1)
Basophils Relative: 0.5 % (ref 0.0–3.0)
Eosinophils Absolute: 0.1 10*3/uL (ref 0.0–0.7)
Eosinophils Relative: 1.4 % (ref 0.0–5.0)
HCT: 44.7 % (ref 39.0–52.0)
Hemoglobin: 15.4 g/dL (ref 13.0–17.0)
Lymphocytes Relative: 25 % (ref 12.0–46.0)
Lymphs Abs: 1.5 10*3/uL (ref 0.7–4.0)
MCHC: 34.3 g/dL (ref 30.0–36.0)
MCV: 88.8 fl (ref 78.0–100.0)
Monocytes Absolute: 0.5 10*3/uL (ref 0.1–1.0)
Monocytes Relative: 9.4 % (ref 3.0–12.0)
Neutro Abs: 3.7 10*3/uL (ref 1.4–7.7)
Neutrophils Relative %: 63.7 % (ref 43.0–77.0)
Platelets: 220 10*3/uL (ref 150.0–400.0)
RBC: 5.04 Mil/uL (ref 4.22–5.81)
RDW: 12.8 % (ref 11.5–15.5)
WBC: 5.8 10*3/uL (ref 4.0–10.5)

## 2020-02-28 LAB — LIPID PANEL
Cholesterol: 175 mg/dL (ref 0–200)
HDL: 34.1 mg/dL — ABNORMAL LOW (ref >39.00)
NonHDL: 140.99
Total CHOL/HDL Ratio: 5
Triglycerides: 241 mg/dL — ABNORMAL HIGH (ref 0.0–149.0)
VLDL: 48.2 mg/dL — ABNORMAL HIGH (ref 0.0–40.0)

## 2020-02-28 LAB — HEPATIC FUNCTION PANEL
ALT: 17 U/L (ref 0–53)
AST: 13 U/L (ref 0–37)
Albumin: 4.6 g/dL (ref 3.5–5.2)
Alkaline Phosphatase: 72 U/L (ref 39–117)
Bilirubin, Direct: 0.1 mg/dL (ref 0.0–0.3)
Total Bilirubin: 0.7 mg/dL (ref 0.2–1.2)
Total Protein: 6.9 g/dL (ref 6.0–8.3)

## 2020-02-28 LAB — HEMOGLOBIN A1C: Hgb A1c MFr Bld: 5.7 % (ref 4.6–6.5)

## 2020-02-28 LAB — GLUCOSE, POCT (MANUAL RESULT ENTRY): POC Glucose: 119 mg/dl — AB (ref 70–99)

## 2020-02-28 LAB — LDL CHOLESTEROL, DIRECT: Direct LDL: 95 mg/dL

## 2020-02-28 MED ORDER — METHYLPREDNISOLONE 4 MG PO TBPK: ORAL_TABLET | ORAL | 0 refills | Status: DC

## 2020-02-28 NOTE — Progress Notes (Addendum)
Patient ID: Fernando Harris, male   DOB: 10/10/1971, 49 y.o.   MRN: [id number]   Subjective:    Patient ID: Fernando Harris, male    DOB: 06-11-1971, 49 y.o.   MRN: [id number]  HPI This visit occurred during the SARS-CoV-2 public health emergency.  Safety protocols were in place, including screening questions prior to the visit, additional usage of staff PPE, and extensive cleaning of exam room while observing appropriate contact time as indicated for disinfecting solutions.  Patient here for a scheduled follow up.  He reports he is doing relatively well.  Work has been stressful.  Working hard.  Uses arms a lot.  Increased pain right shoulder, axilla and arm.  Some discomfort with looking from right to left.  Increased numbness and tingling.  Tries to stay active.  No chest pain or sob.  Notices on weekends - will have intermittent episodes of increased heart rate, sweating. Notices on weekend when he eats breakfast later.  Did notice on one occasion - occurred after eating pancakes.  Discussed the possibility of low blood sugar, but did occur after eating as well.  Discussed importance of eating regular meals and appropriate protein intake.  No acid reflux reported.  No abdominal pain.  Bowels moving.    Past Medical History:  Diagnosis Date  . Bulging disc    evaluated [facility]  . GERD (gastroesophageal reflux disease)   . Hypertension   . Nephrolithiasis    Past Surgical History:  Procedure Laterality Date  . ELBOW SURGERY    . INGUINAL HERNIA REPAIR    . TONSILLECTOMY     Family History  Problem Relation Age of Onset  . Hypertension Father   . Diabetes Paternal Grandmother    Social History   Socioeconomic History  . Marital status: Married    Spouse name: Not on file  . Number of children: Not on file  . Years of education: Not on file  . Highest education level: Not on file  Occupational History  . Not on file  Tobacco Use  . Smoking status: Former Smoker     Packs/day: 1.00    Years: 15.00    Pack years: 15.00    Types: Cigarettes    Quit date: 04/18/2017    Years since quitting: 2.8  . Smokeless tobacco: Former Systems developer    Types: Snuff    Quit date: 08/21/2013  Substance and Sexual Activity  . Alcohol use: Yes    Alcohol/week: 0.0 standard drinks  . Drug use: No  . Sexual activity: Not on file  Other Topics Concern  . Not on file  Social History Narrative   Married    Daughter    Social Determinants of Health   Financial Resource Strain:   . Difficulty of Paying Living Expenses:   Food Insecurity:   . Worried About Charity fundraiser in the Last Year:   . Arboriculturist in the Last Year:   Transportation Needs:   . Film/video editor (Medical):   Marland Kitchen Lack of Transportation (Non-Medical):   Physical Activity:   . Days of Exercise per Week:   . Minutes of Exercise per Session:   Stress:   . Feeling of Stress :   Social Connections:   . Frequency of Communication with Friends and Family:   . Frequency of Social Gatherings with Friends and Family:   . Attends Religious Services:   . Active Member of Clubs or Organizations:   .  Attends Archivist Meetings:   Marland Kitchen Marital Status:     Outpatient Encounter Medications as of 02/28/2020  Medication Sig  . amLODipine (NORVASC) 5 MG tablet TAKE 1 TABLET BY MOUTH EVERY DAY  . atorvastatin (LIPITOR) 10 MG tablet TAKE 1 TABLET BY MOUTH EVERY DAY  . clonazePAM (KLONOPIN) 0.5 MG tablet TAKE 1 TABLET BY MOUTH AT BEDTIME AS NEEDED FOR ANXIETY.  . [facility]S VITAMIN B12 1000 MCG tablet TAKE 1 TABLET BY MOUTH EVERY DAY  . losartan (COZAAR) 25 MG tablet TAKE 1 TABLET BY MOUTH EVERY DAY  . methylPREDNISolone (MEDROL DOSEPAK) 4 MG TBPK tablet Medrol dosepack - 6 day taper.  Take as directed.  Marland Kitchen omeprazole (PRILOSEC) 40 MG capsule TAKE 1 CAPSULE BY MOUTH EVERY DAY  . pramoxine-hydrocortisone (ANALPRAM-HC) 1-1 % rectal cream Place 1 application rectally 2 (two) times daily.   No  facility-administered encounter medications on file as of 02/28/2020.    Review of Systems  Constitutional: Negative for appetite change and unexpected weight change.  HENT: Negative for congestion and sinus pressure.   Respiratory: Negative for cough, chest tightness and shortness of breath.   Cardiovascular: Negative for chest pain, palpitations and leg swelling.       Episodes of increased heart rate and sweating as outlined.  Gastrointestinal: Negative for abdominal pain, diarrhea, nausea and vomiting.  Genitourinary: Negative for difficulty urinating and dysuria.  Musculoskeletal: Negative for joint swelling and myalgias.       Right shoulder and arm pain as outlined.    Skin: Negative for color change and rash.  Neurological: Negative for dizziness, light-headedness and headaches.  Psychiatric/Behavioral: Negative for agitation and dysphoric mood.      Objective:    Physical Exam Constitutional:      General: He is not in acute distress.    Appearance: Normal appearance. He is well-developed.  HENT:     Head: Normocephalic and atraumatic.     Right Ear: External ear normal.     Left Ear: External ear normal.  Eyes:     General: No scleral icterus.       Right eye: No discharge.        Left eye: No discharge.     Conjunctiva/sclera: Conjunctivae normal.  Cardiovascular:     Rate and Rhythm: Normal rate and regular rhythm.  Pulmonary:     Effort: Pulmonary effort is normal. No respiratory distress.     Breath sounds: Normal breath sounds.  Abdominal:     General: Bowel sounds are normal.     Palpations: Abdomen is soft.     Tenderness: There is no abdominal tenderness.  Musculoskeletal:        General: No swelling or tenderness.     Cervical back: Neck supple. No tenderness.     Comments: Increased pain to palpation - right posterior shoulder and palpation upper arm.  Increased pain with rotation of head from right to left.    Lymphadenopathy:     Cervical: No  cervical adenopathy.  Skin:    Findings: No erythema or rash.  Neurological:     Mental Status: He is alert.  Psychiatric:        Mood and Affect: Mood normal.        Behavior: Behavior normal.     BP 138/80   Pulse 84   Temp 97.8 F (36.6 C)   Resp 16   Ht 5' 10"  (1.778 m)   Wt 214 lb 3.2 oz (97.2 kg)  SpO2 99%   BMI 30.73 kg/m  Wt Readings from Last 3 Encounters:  02/28/20 214 lb 3.2 oz (97.2 kg)  08/28/19 219 lb 6.4 oz (99.5 kg)  11/13/18 208 lb 12.8 oz (94.7 kg)     Lab Results  Component Value Date   WBC 5.8 02/28/2020   HGB 15.4 02/28/2020   HCT 44.7 02/28/2020   PLT 220.0 02/28/2020   GLUCOSE 109 (H) 02/28/2020   CHOL 175 02/28/2020   TRIG 241.0 (H) 02/28/2020   HDL 34.10 (L) 02/28/2020   LDLDIRECT 95.0 02/28/2020   LDLCALC 67 10/04/2019   ALT 17 02/28/2020   AST 13 02/28/2020   NA 138 02/28/2020   K 4.7 02/28/2020   CL 103 02/28/2020   CREATININE 1.03 02/28/2020   BUN 12 02/28/2020   CO2 30 02/28/2020   TSH 0.62 08/28/2019   PSA 0.23 08/28/2019   HGBA1C 5.7 02/28/2020    DG Chest 2 View  Result Date: 08/31/2015 CLINICAL DATA:  Intermittent chest pain for 2 weeks, pain radiates to RIGHT arm. Elevated hypertension. EXAM: CHEST  2 VIEW COMPARISON:  None. FINDINGS: Cardiomediastinal silhouette is normal. Linear densities LEFT lung base. The lungs are otherwise clear without pleural effusions or focal consolidations. Trachea projects midline and there is no pneumothorax. Soft tissue planes and included osseous structures are non-suspicious. Stable mild cortical thickening and focal sclerosis LEFT posterior ninth rib. IMPRESSION: No acute cardiopulmonary process. Minimal LEFT lung base atelectasis/scarring. Electronically Signed   By: Elon Alas M.D.   On: 08/31/2015 23:18       Assessment & Plan:   Problem List Items Addressed This Visit    Essential hypertension, benign    Blood pressure as outlined.  Have him spot check his pressure.   Continue losartan.  Follow pressures.  If persistent elevation will require medication adjustment.        Relevant Orders   CBC with Differential/Platelet (Completed)   Basic metabolic panel (Completed)   Hypercholesterolemia - Primary    Off lipitor.  Low cholesterol diet and exercise.  Follow lipid panel.        Relevant Orders   Hepatic function panel (Completed)   Lipid panel (Completed)   LDL cholesterol, direct (Completed)   Hyperglycemia    Low carb diet and exercise.  Follow met b and a1c.       Relevant Orders   Hemoglobin A1c (Completed)   POCT Glucose (CBG) (Completed)   Increased heart rate    Intermittent episodes of increased heart rate and sweating as outlined.  Discussed possibility of low sugar. Discussed eating regular meals and increased protein intake. Given persistent intermittent episodes, EKG - SR with TWI in V3 and some flattening in V2.  Discussed cardiology evaluation to confirm if any further cardiac w/up warranted.  He is in agreement.        Relevant Orders   EKG 12-Lead   Ambulatory referral to Cardiology   Neck pain    Pain in neck and shoulder as outlined.  Previously saw ortho.  S/p injection previously.  Recent flare.  Medrol dose pack as directed.  Call with update.       Right arm pain    Right arm pain and numbness and tingling as outlined.  Question if originating from his neck.  Previously went to PT.  Helped.  Is doing exercises.  Trial of medrol dose pack.  If persistent pain, may need ortho evaluation.       Tobacco abuse  Using nicotine patch.  Trying to stop smoking.  Follow.           I spent 40 minutes with the patient and more than 50% of the time was spent in consultation regarding the above.  Time spent discussing his current concerns, discussing further w/up and treatment.   Einar Pheasant, MD

## 2020-02-29 ENCOUNTER — Encounter: Payer: Self-pay | Admitting: Internal Medicine

## 2020-02-29 NOTE — Assessment & Plan Note (Signed)
Off lipitor.  Low cholesterol diet and exercise.  Follow lipid panel.   

## 2020-02-29 NOTE — Assessment & Plan Note (Addendum)
Intermittent episodes of increased heart rate and sweating as outlined.  Discussed possibility of low sugar. Discussed eating regular meals and increased protein intake. Given persistent intermittent episodes, EKG - SR with TWI in V3 and some flattening in V2.  Discussed cardiology evaluation to confirm if any further cardiac w/up warranted.  He is in agreement.

## 2020-02-29 NOTE — Assessment & Plan Note (Signed)
Low carb diet and exercise.  Follow met b and a1c.  

## 2020-02-29 NOTE — Assessment & Plan Note (Signed)
Using nicotine patch.  Trying to stop smoking.  Follow.   

## 2020-02-29 NOTE — Assessment & Plan Note (Signed)
Right arm pain and numbness and tingling as outlined.  Question if originating from his neck.  Previously went to PT.  Helped.  Is doing exercises.  Trial of medrol dose pack.  If persistent pain, may need ortho evaluation.

## 2020-02-29 NOTE — Assessment & Plan Note (Signed)
Pain in neck and shoulder as outlined.  Previously saw ortho.  S/p injection previously.  Recent flare.  Medrol dose pack as directed.  Call with update.

## 2020-02-29 NOTE — Assessment & Plan Note (Signed)
Blood pressure as outlined.  Have him spot check his pressure.  Continue losartan.  Follow pressures.  If persistent elevation will require medication adjustment.

## 2020-03-03 ENCOUNTER — Encounter: Payer: Self-pay | Admitting: Cardiology

## 2020-03-03 ENCOUNTER — Other Ambulatory Visit: Payer: Self-pay

## 2020-03-03 ENCOUNTER — Ambulatory Visit (INDEPENDENT_AMBULATORY_CARE_PROVIDER_SITE_OTHER): Payer: BC Managed Care – PPO | Admitting: Cardiology

## 2020-03-03 VITALS — BP 148/106 | HR 73 | Ht 70.0 in | Wt 213.5 lb

## 2020-03-03 DIAGNOSIS — R9431 Abnormal electrocardiogram [ECG] [EKG]: Secondary | ICD-10-CM

## 2020-03-03 DIAGNOSIS — R002 Palpitations: Secondary | ICD-10-CM

## 2020-03-03 DIAGNOSIS — R479 Unspecified speech disturbances: Secondary | ICD-10-CM

## 2020-03-03 DIAGNOSIS — I10 Essential (primary) hypertension: Secondary | ICD-10-CM

## 2020-03-03 MED ORDER — ASPIRIN EC 81 MG PO TBEC: 81.0000 mg | DELAYED_RELEASE_TABLET | Freq: Every day | ORAL | 3 refills | Status: AC

## 2020-03-03 MED ORDER — LOSARTAN POTASSIUM 50 MG PO TABS
50.0000 mg | ORAL_TABLET | Freq: Every day | ORAL | 1 refills | Status: DC
Start: 2020-03-03 — End: 2020-10-13

## 2020-03-03 NOTE — Patient Instructions (Signed)
Medication Instructions:  Your physician has recommended you make the following change in your medication:  1- START Aspirin 81 mg by mouth once a day - over-the-counter. 2- INCREASE Losartan to 50 mg by mouth once a day.  *If you need a refill on your cardiac medications before your next appointment, please call your pharmacy*  Follow-Up: At [facility], you and your health needs are our priority.  As part of our continuing mission to provide you with exceptional heart care, we have created designated Provider Care Teams.  These Care Teams include your primary Cardiologist (physician) and Advanced Practice Providers (APPs -  Physician Assistants and Nurse Practitioners) who all work together to provide you with the care you need, when you need it.  We recommend signing up for the patient portal called "MyChart".  Sign up information is provided on this After Visit Summary.  MyChart is used to connect with patients for Virtual Visits (Telemedicine).  Patients are able to view lab/test results, encounter notes, upcoming appointments, etc.  Non-urgent messages can be sent to your provider as well.   To learn more about what you can do with MyChart, go to [email or web address].    Your next appointment:   2 month(s)  The format for your next appointment:   In Person  Provider:   Debbe Odea, MD

## 2020-03-03 NOTE — Progress Notes (Signed)
Cardiology Office Note:    Date:  03/03/2020   ID:  [name], DOB [birth year], MRN [id number]  PCP:  Dale [place], MD  Cardiologist:  Debbe Odea, MD  Electrophysiologist:  None   Referring MD: Dale Sturgis, MD   Chief Complaint  Patient presents with  . OTHER    Increased HR c/o elevated BP. Meds reviewed verbally with pt.   Fernando Harris is a 49 y.o. male who is being seen today for the evaluation of increased heart rates at the request of Dale Cavalero, MD.   History of Present Illness:    Fernando Harris is a 49 y.o. male with a hx of hypertension, GERD, former smoker x15 to 20 years who presents due to increased heart rates and elevated blood pressure.  Patient states having symptoms of palpitations, diaphoresis about 3 times over the past several weeks.  Last occurrence was 3 weeks ago.  He states symptoms typically resolve after eating something such as a protein bar or candy.  He states not eating breakfast on 2 later while he is at work.  He otherwise denies any history of heart disease.  Denies chest pain or shortness of breath at rest or with exertion.  He has noticed occasional slurring in his speech while at work, on the phone.  Denies any weakness.  EKG at his primary care physician's office during a regular visit showed nonspecific T wave changes.  Patient checks his blood pressure frequently at home and diastolic usually around 100s, systolic 130s.  Past Medical History:  Diagnosis Date  . Bulging disc    evaluated [facility]  . GERD (gastroesophageal reflux disease)   . Hypertension   . Nephrolithiasis     Past Surgical History:  Procedure Laterality Date  . ELBOW SURGERY    . INGUINAL HERNIA REPAIR    . TONSILLECTOMY      Current Medications: Current Meds  Medication Sig  . amLODipine (NORVASC) 5 MG tablet TAKE 1 TABLET BY MOUTH EVERY DAY  . atorvastatin (LIPITOR) 10 MG tablet TAKE 1 TABLET BY MOUTH EVERY DAY  . clonazePAM  (KLONOPIN) 0.5 MG tablet TAKE 1 TABLET BY MOUTH AT BEDTIME AS NEEDED FOR ANXIETY.  . [employer] VITAMIN B12 1000 MCG tablet TAKE 1 TABLET BY MOUTH EVERY DAY  . methylPREDNISolone (MEDROL DOSEPAK) 4 MG TBPK tablet Medrol dosepack - 6 day taper.  Take as directed.  Marland Kitchen omeprazole (PRILOSEC) 40 MG capsule TAKE 1 CAPSULE BY MOUTH EVERY DAY  . [DISCONTINUED] losartan (COZAAR) 25 MG tablet TAKE 1 TABLET BY MOUTH EVERY DAY     Allergies:   Aleve [naproxen]   Social History   Socioeconomic History  . Marital status: Married    Spouse name: Not on file  . Number of children: Not on file  . Years of education: Not on file  . Highest education level: Not on file  Occupational History  . Not on file  Tobacco Use  . Smoking status: Former Smoker    Packs/day: 1.00    Years: 15.00    Pack years: 15.00    Types: Cigarettes    Quit date: 04/18/2017    Years since quitting: 2.8  . Smokeless tobacco: Former Neurosurgeon    Types: Snuff    Quit date: 08/21/2013  Substance and Sexual Activity  . Alcohol use: Yes    Alcohol/week: 0.0 standard drinks    Comment: OCCASSIONAL  . Drug use: No  . Sexual activity: Not on file  Other Topics Concern  . Not on file  Social History Narrative   Married    Daughter    Social Determinants of Health   Financial Resource Strain:   . Difficulty of Paying Living Expenses:   Food Insecurity:   . Worried About Charity fundraiser in the Last Year:   . Arboriculturist in the Last Year:   Transportation Needs:   . Film/video editor (Medical):   Marland Kitchen Lack of Transportation (Non-Medical):   Physical Activity:   . Days of Exercise per Week:   . Minutes of Exercise per Session:   Stress:   . Feeling of Stress :   Social Connections:   . Frequency of Communication with Friends and Family:   . Frequency of Social Gatherings with Friends and Family:   . Attends Religious Services:   . Active Member of Clubs or Organizations:   . Attends Archivist Meetings:     Marland Kitchen Marital Status:      Family History: The patient's family history includes Diabetes in his paternal grandmother; Heart Problems in his paternal grandmother; Hypertension in his father.  ROS:   Please see the history of present illness.     All other systems reviewed and are negative.  EKGs/Labs/Other Studies Reviewed:    The following studies were reviewed today:   EKG:  EKG is  ordered today.  The ekg ordered today demonstrates sinus rhythm, nonspecific ST changes.  Recent Labs: 08/28/2019: TSH 0.62 02/28/2020: ALT 17; BUN 12; Creatinine, Ser 1.03; Hemoglobin 15.4; Platelets 220.0; Potassium 4.7; Sodium 138  Recent Lipid Panel    Component Value Date/Time   CHOL 175 02/28/2020 0846   TRIG 241.0 (H) 02/28/2020 0846   HDL 34.10 (L) 02/28/2020 0846   CHOLHDL 5 02/28/2020 0846   VLDL 48.2 (H) 02/28/2020 0846   LDLCALC 67 10/04/2019 0805   LDLDIRECT 95.0 02/28/2020 0846    Physical Exam:    VS:  BP (!) 148/106 (BP Location: Right Arm, Patient Position: Sitting, Cuff Size: Normal)   Pulse 73   Ht 5\' 10"  (1.778 m)   Wt 213 lb 8 oz (96.8 kg)   SpO2 98%   BMI 30.63 kg/m     Wt Readings from Last 3 Encounters:  03/03/20 213 lb 8 oz (96.8 kg)  02/28/20 214 lb 3.2 oz (97.2 kg)  08/28/19 219 lb 6.4 oz (99.5 kg)     GEN:  Well nourished, well developed in no acute distress HEENT: Normal NECK: No JVD; No carotid bruits LYMPHATICS: No lymphadenopathy CARDIAC: RRR, no murmurs, rubs, gallops RESPIRATORY:  Clear to auscultation without rales, wheezing or rhonchi  ABDOMEN: Soft, non-tender, non-distended MUSCULOSKELETAL:  No edema; No deformity  SKIN: Warm and dry NEUROLOGIC:  Alert and oriented x 3 PSYCHIATRIC:  Normal affect   ASSESSMENT:    1. Essential hypertension   2. Palpitations   3. Nonspecific abnormal electrocardiogram (ECG) (EKG)   4. Speech disturbance, unspecified type    PLAN:    In order of problems listed above:  1. Patient with history of  hypertension, blood pressure not adequately controlled.  Increase losartan to 50 mg daily.  Continue amlodipine 5 mg daily. 2. He has history of tachycardia/palpitations , most consistent with episodes of hypoglycemia.  No further cardiac intervention or testing indicated at this time. 3. Patient with nonspecific changes on EKG, no symptoms of chest pain or shortness of breath at rest or with exertion.  Continue management of cardiac  risk factors such as hypertension, hyperlipidemia. 4. Patient with occasional slurred speech while on the phone.  Denies any muscle weakness.  He has risk factors for cardiovascular disease such as prior smoker, hypertension, hyperlipidemia.  Recommend he starts aspirin 81 mg daily, continue Lipitor.  If symptoms persist, recommend neuro eval, I will defer to primary care physician regarding this.  Follow-up in 2 months.  This note was generated in part or whole with voice recognition software. Voice recognition is usually quite accurate but there are transcription errors that can and very often do occur. I apologize for any typographical errors that were not detected and corrected.  Medication Adjustments/Labs and Tests Ordered: Current medicines are reviewed at length with the patient today.  Concerns regarding medicines are outlined above.  Orders Placed This Encounter  Procedures  . EKG 12-Lead   Meds ordered this encounter  Medications  . aspirin EC 81 MG tablet    Sig: Take 1 tablet (81 mg total) by mouth daily.    Dispense:  90 tablet    Refill:  3  . losartan (COZAAR) 50 MG tablet    Sig: Take 1 tablet (50 mg total) by mouth daily.    Dispense:  90 tablet    Refill:  1    Patient Instructions  Medication Instructions:  Your physician has recommended you make the following change in your medication:  1- START Aspirin 81 mg by mouth once a day - over-the-counter. 2- INCREASE Losartan to 50 mg by mouth once a day.  *If you need a refill on your  cardiac medications before your next appointment, please call your pharmacy*  Follow-Up: At [facility], you and your health needs are our priority.  As part of our continuing mission to provide you with exceptional heart care, we have created designated Provider Care Teams.  These Care Teams include your primary Cardiologist (physician) and Advanced Practice Providers (APPs -  Physician Assistants and Nurse Practitioners) who all work together to provide you with the care you need, when you need it.  We recommend signing up for the patient portal called "MyChart".  Sign up information is provided on this After Visit Summary.  MyChart is used to connect with patients for Virtual Visits (Telemedicine).  Patients are able to view lab/test results, encounter notes, upcoming appointments, etc.  Non-urgent messages can be sent to your provider as well.   To learn more about what you can do with MyChart, go to [email or web address].    Your next appointment:   2 month(s)  The format for your next appointment:   In Person  Provider:   Debbe Odea, MD      Signed, Debbe Odea, MD  03/03/2020 9:05 AM    [facility]

## 2020-03-04 ENCOUNTER — Telehealth: Payer: Self-pay | Admitting: Internal Medicine

## 2020-03-04 NOTE — Telephone Encounter (Signed)
LMTCB

## 2020-03-04 NOTE — Telephone Encounter (Signed)
Received note from cardiology that pt noticed some slurring of speech. They started him on aspirin.  Need more information. When occurred, how often, any other symptoms, etc.   Also, can see if he can make appt this am for further evaluation.

## 2020-03-06 NOTE — Telephone Encounter (Signed)
Patient is unable to come in for any 12:00 appts. He works in [employer]. Wanted to know if it is ok to keep 6/4 appt or do something early am/late pm.

## 2020-03-06 NOTE — Telephone Encounter (Signed)
Per our discussion, pt wanted virtual visit Monday.

## 2020-03-06 NOTE — Telephone Encounter (Signed)
Please move appt earlier.  If any acute symptoms, needs to be evaluated.

## 2020-03-06 NOTE — Telephone Encounter (Signed)
Patient confirmed no other symptoms. Doing ok. Has noticed it about 4-5 times over the last 6 months and it is for a few seconds. Doing ok. Has f/u appt 6/4.

## 2020-03-07 NOTE — Telephone Encounter (Signed)
Pt scheduled for virtual.  ?

## 2020-03-10 ENCOUNTER — Telehealth (INDEPENDENT_AMBULATORY_CARE_PROVIDER_SITE_OTHER): Payer: BC Managed Care – PPO | Admitting: Internal Medicine

## 2020-03-10 DIAGNOSIS — R4781 Slurred speech: Secondary | ICD-10-CM

## 2020-03-10 DIAGNOSIS — I10 Essential (primary) hypertension: Secondary | ICD-10-CM

## 2020-03-10 DIAGNOSIS — E7800 Pure hypercholesterolemia, unspecified: Secondary | ICD-10-CM

## 2020-03-10 DIAGNOSIS — R739 Hyperglycemia, unspecified: Secondary | ICD-10-CM | POA: Diagnosis not present

## 2020-03-10 DIAGNOSIS — Z720 Tobacco use: Secondary | ICD-10-CM

## 2020-03-10 NOTE — Progress Notes (Addendum)
Patient ID: Fernando Harris, male   DOB: Dec 14, 1970, 49 y.o.   MRN: [id number]   Virtual Visit via video Note  This visit type was conducted due to national recommendations for restrictions regarding the COVID-19 pandemic (e.g. social distancing).  This format is felt to be most appropriate for this patient at this time.  All issues noted in this document were discussed and addressed.  No physical exam was performed (except for noted visual exam findings with Video Visits).   I connected with [name] by a video enabled telemedicine application and verified that I am speaking with the correct person using two identifiers. Location patient: home Location provider: work  Persons participating in the virtual visit: patient, provider  The limitations, risks, security and privacy concerns of performing an evaluation and management service by telephone and the availability of in person appointments have been discussed.  It has also been discussed with the patient that there may be a patient responsible charge related to this service. The patient has expressed understanding and has agreed to proceed.   Reason for visit: work in appt  HPI: Work in appt after reviewing cardiology note that pt had experienced slurred speech.  He was started on aspirin daily.  Taking.  On questioning him, he states this occurs when on phone at work.  Unsure if actually slurred speech.  Was questioning if occurs when he talks fast.  Notices on word in sentence may be questionably slurred.  Has occurred on a few occasions.  No other symptoms.  No headache.  No difficulty swallowing.  No other neurological changes.  Some increased stress.  Overall handling things relatively well.  Blood pressure averaging 129-131/97.  He also reports noticing tongue feeling a little numb.  No symptoms now.  No chest pain or sob.  No acid reflux.     ROS: See pertinent positives and negatives per HPI.  Past Medical History:  Diagnosis  Date  . Bulging disc    evaluated [facility]  . GERD (gastroesophageal reflux disease)   . Hypertension   . Nephrolithiasis     Past Surgical History:  Procedure Laterality Date  . ELBOW SURGERY    . INGUINAL HERNIA REPAIR    . TONSILLECTOMY      Family History  Problem Relation Age of Onset  . Hypertension Father   . Diabetes Paternal Grandmother   . Heart Problems Paternal Grandmother     SOCIAL HX: reviewed.    Current Outpatient Medications:  .  amLODipine (NORVASC) 5 MG tablet, TAKE 1 TABLET BY MOUTH EVERY DAY, Disp: 90 tablet, Rfl: 1 .  aspirin EC 81 MG tablet, Take 1 tablet (81 mg total) by mouth daily., Disp: 90 tablet, Rfl: 3 .  atorvastatin (LIPITOR) 10 MG tablet, Take 1 tablet (10 mg total) by mouth daily., Disp: 90 tablet, Rfl: 1 .  clonazePAM (KLONOPIN) 0.5 MG tablet, TAKE 1 TABLET BY MOUTH AT BEDTIME AS NEEDED FOR ANXIETY., Disp: 30 tablet, Rfl: 0 .  CVS VITAMIN B12 1000 MCG tablet, TAKE 1 TABLET BY MOUTH EVERY DAY, Disp: 90 tablet, Rfl: 1 .  losartan (COZAAR) 50 MG tablet, Take 1 tablet (50 mg total) by mouth daily., Disp: 90 tablet, Rfl: 1 .  omeprazole (PRILOSEC) 40 MG capsule, TAKE 1 CAPSULE BY MOUTH EVERY DAY, Disp: 90 capsule, Rfl: 1  EXAM:  GENERAL: alert, oriented, appears well and in no acute distress  HEENT: atraumatic, conjunttiva clear, no obvious abnormalities on inspection of external nose and  ears  NECK: normal movements of the head and neck  LUNGS: on inspection no signs of respiratory distress, breathing rate appears normal, no obvious gross SOB, gasping or wheezing  CV: no obvious cyanosis  PSYCH/NEURO: pleasant and cooperative, no obvious depression or anxiety, speech and thought processing grossly intact  ASSESSMENT AND PLAN:  Discussed the following assessment and plan:  Hyperglycemia Low carb diet and exercise.  Follow met b and a1c.    Hypercholesterolemia On lipitor.  Continue.  Low cholesterol diet and exercise.   Follow lipid panel and liver function tests.    Essential hypertension, benign Blood pressure elevated on his check. Have him spot check his pressure and send in readings.  Continue amlodipine and losartan.  Follow pressures.  Adjust medication if persistent elevation.    Slurred speech He had reported to cardiology noticing slurring speech - occasional episodes.  On questioning today, unclear if speech slurred.  See HPI for details.  Some numbness noted in tongue previously.  No symptoms now.  On aspirin daily.  Discussed further w/up.  No other neurological symptoms.  No swallowing problems.  Discussed further w/up and evaluation.  Desires to hold on scan.  Did agree to carotid ultrasound and referral to neurology.  Continue aspirin daily.    Tobacco abuse Using nicotine patch.  Trying to stop smoking.  Follow.      I discussed the assessment and treatment plan with the patient. The patient was provided an opportunity to ask questions and all were answered. The patient agreed with the plan and demonstrated an understanding of the instructions.   The patient was advised to call back or seek an in-person evaluation if the symptoms worsen or if the condition fails to improve as anticipated.   Einar Pheasant, MD

## 2020-03-16 ENCOUNTER — Encounter: Payer: Self-pay | Admitting: Internal Medicine

## 2020-03-17 ENCOUNTER — Encounter: Payer: Self-pay | Admitting: Internal Medicine

## 2020-03-17 DIAGNOSIS — R4781 Slurred speech: Secondary | ICD-10-CM | POA: Insufficient documentation

## 2020-03-17 MED ORDER — ATORVASTATIN CALCIUM 10 MG PO TABS: 10.0000 mg | ORAL_TABLET | Freq: Every day | ORAL | 1 refills | Status: DC

## 2020-03-17 NOTE — Assessment & Plan Note (Signed)
He had reported to cardiology noticing slurring speech - occasional episodes.  On questioning today, unclear if speech slurred.  See HPI for details.  Some numbness noted in tongue previously.  No symptoms now.  On aspirin daily.  Discussed further w/up.  No other neurological symptoms.  No swallowing problems.  Discussed further w/up and evaluation.  Desires to hold on scan.  Did agree to carotid ultrasound and referral to neurology.  Continue aspirin daily.

## 2020-03-17 NOTE — Assessment & Plan Note (Signed)
On lipitor.  Continue.  Low cholesterol diet and exercise.  Follow lipid panel and liver function tests.

## 2020-03-17 NOTE — Assessment & Plan Note (Signed)
Low carb diet and exercise.  Follow met b and a1c.   

## 2020-03-17 NOTE — Assessment & Plan Note (Signed)
Blood pressure elevated on his check. Have him spot check his pressure and send in readings.  Continue amlodipine and losartan.  Follow pressures.  Adjust medication if persistent elevation.

## 2020-03-17 NOTE — Addendum Note (Signed)
Addended by: Charm Barges on: 03/17/2020 01:09 PM   Modules accepted: Orders

## 2020-03-17 NOTE — Assessment & Plan Note (Signed)
Using nicotine patch.  Trying to stop smoking.  Follow.

## 2020-03-28 ENCOUNTER — Ambulatory Visit: Payer: BC Managed Care – PPO | Admitting: Internal Medicine

## 2020-03-31 DIAGNOSIS — I6523 Occlusion and stenosis of bilateral carotid arteries: Secondary | ICD-10-CM | POA: Diagnosis not present

## 2020-03-31 DIAGNOSIS — R4702 Dysphasia: Secondary | ICD-10-CM | POA: Diagnosis not present

## 2020-03-31 DIAGNOSIS — H538 Other visual disturbances: Secondary | ICD-10-CM | POA: Diagnosis not present

## 2020-04-08 DIAGNOSIS — I6523 Occlusion and stenosis of bilateral carotid arteries: Secondary | ICD-10-CM | POA: Insufficient documentation

## 2020-04-11 ENCOUNTER — Other Ambulatory Visit: Payer: Self-pay | Admitting: Internal Medicine

## 2020-04-21 DIAGNOSIS — F411 Generalized anxiety disorder: Secondary | ICD-10-CM | POA: Diagnosis not present

## 2020-04-22 ENCOUNTER — Ambulatory Visit: Payer: BC Managed Care – PPO | Admitting: Internal Medicine

## 2020-04-22 ENCOUNTER — Other Ambulatory Visit: Payer: Self-pay

## 2020-04-22 VITALS — BP 126/74 | HR 85 | Temp 98.2°F | Resp 16 | Wt 198.0 lb

## 2020-04-22 DIAGNOSIS — R739 Hyperglycemia, unspecified: Secondary | ICD-10-CM | POA: Diagnosis not present

## 2020-04-22 DIAGNOSIS — F419 Anxiety disorder, unspecified: Secondary | ICD-10-CM

## 2020-04-22 DIAGNOSIS — R4781 Slurred speech: Secondary | ICD-10-CM | POA: Diagnosis not present

## 2020-04-22 DIAGNOSIS — E7800 Pure hypercholesterolemia, unspecified: Secondary | ICD-10-CM

## 2020-04-22 MED ORDER — CLONAZEPAM 0.5 MG PO TABS: ORAL_TABLET | ORAL | 0 refills | Status: DC

## 2020-04-22 NOTE — Progress Notes (Signed)
Patient ID: Fernando Harris, male   DOB: 29-Jan-1971, 49 y.o.   MRN: [id number]   Subjective:    Patient ID: Fernando Harris, male    DOB: 11-10-1970, 49 y.o.   MRN: [id number]  HPI This visit occurred during the SARS-CoV-2 public health emergency.  Safety protocols were in place, including screening questions prior to the visit, additional usage of staff PPE, and extensive cleaning of exam room while observing appropriate contact time as indicated for disinfecting solutions.  Patient here as a work in appt to discuss increased anxiety.  Last visit, concern regarding possible slurring of speech.  Was referred to neurology.  Saw Dr Melrose Nakayama - 03/31/20.  Recommended continuing '81mg'$  aspirin daily.  Recommended carotid ultrasound.  Started on celexa.  Has been taking celexa.  Reports this past weekend, increased anxiety.  Calmed down some while working.  Next day even more anxious.  Not wanting to eat.  Went to [facility] for evaluation.  Has adjusted his diet.  Trying to watch what he eats.  Here today to discuss increased anxiety. Feels celexa is aggravating.  Not sleeping.  Stopped celexa two days ago.  Blood pressures averaging 118-120/84.  Previously took clonazepam prn.  Feels needs refill clonazepam to have prn.    Past Medical History:  Diagnosis Date  . Bulging disc    evaluated [facility]  . GERD (gastroesophageal reflux disease)   . Hypertension   . Nephrolithiasis    Past Surgical History:  Procedure Laterality Date  . ELBOW SURGERY    . INGUINAL HERNIA REPAIR    . TONSILLECTOMY     Family History  Problem Relation Age of Onset  . Hypertension Father   . Diabetes Paternal Grandmother   . Heart Problems Paternal Grandmother    Social History   Socioeconomic History  . Marital status: Married    Spouse name: Not on file  . Number of children: Not on file  . Years of education: Not on file  . Highest education level: Not on file  Occupational History  . Not on file    Tobacco Use  . Smoking status: Former Smoker    Packs/day: 1.00    Years: 15.00    Pack years: 15.00    Types: Cigarettes    Quit date: 04/18/2017    Years since quitting: 3.0  . Smokeless tobacco: Former Systems developer    Types: Snuff    Quit date: 08/21/2013  Substance and Sexual Activity  . Alcohol use: Yes    Alcohol/week: 0.0 standard drinks    Comment: OCCASSIONAL  . Drug use: No  . Sexual activity: Not on file  Other Topics Concern  . Not on file  Social History Narrative   Married    Daughter    Social Determinants of Health   Financial Resource Strain:   . Difficulty of Paying Living Expenses:   Food Insecurity:   . Worried About Charity fundraiser in the Last Year:   . Arboriculturist in the Last Year:   Transportation Needs:   . Film/video editor (Medical):   Marland Kitchen Lack of Transportation (Non-Medical):   Physical Activity:   . Days of Exercise per Week:   . Minutes of Exercise per Session:   Stress:   . Feeling of Stress :   Social Connections:   . Frequency of Communication with Friends and Family:   . Frequency of Social Gatherings with Friends and Family:   . Attends Religious  Services:   . Active Member of Clubs or Organizations:   . Attends Archivist Meetings:   Marland Kitchen Marital Status:     Outpatient Encounter Medications as of 04/22/2020  Medication Sig  . amLODipine (NORVASC) 5 MG tablet TAKE 1 TABLET BY MOUTH EVERY DAY  . aspirin EC 81 MG tablet Take 1 tablet (81 mg total) by mouth daily.  Marland Kitchen atorvastatin (LIPITOR) 10 MG tablet Take 1 tablet (10 mg total) by mouth daily.  . citalopram (CELEXA) 10 MG tablet Take 10 mg by mouth daily.  . clonazePAM (KLONOPIN) 0.5 MG tablet 1/2 - 1 tablet q day prn.  . [facility] VITAMIN B12 1000 MCG tablet TAKE 1 TABLET BY MOUTH EVERY DAY  . losartan (COZAAR) 50 MG tablet Take 1 tablet (50 mg total) by mouth daily.  Marland Kitchen omeprazole (PRILOSEC) 40 MG capsule TAKE 1 CAPSULE BY MOUTH EVERY DAY  . [DISCONTINUED] clonazePAM  (KLONOPIN) 0.5 MG tablet TAKE 1 TABLET BY MOUTH AT BEDTIME AS NEEDED FOR ANXIETY.   No facility-administered encounter medications on file as of 04/22/2020.    Review of Systems  Constitutional:       Has adjusted his diet.  Lost weight.    HENT: Negative for congestion and sinus pressure.   Respiratory: Negative for cough, chest tightness and shortness of breath.   Cardiovascular: Negative for chest pain, palpitations and leg swelling.  Gastrointestinal: Negative for abdominal pain, diarrhea, nausea and vomiting.       Decreased appetite.    Genitourinary: Negative for difficulty urinating and dysuria.  Musculoskeletal: Negative for joint swelling and myalgias.  Skin: Negative for color change and rash.  Neurological: Negative for dizziness, light-headedness and headaches.  Psychiatric/Behavioral: Positive for sleep disturbance. Negative for dysphoric mood.       Increased stress.  Increased anxiety.  Not sleeping.         Objective:    Physical Exam Vitals reviewed.  Constitutional:      General: He is not in acute distress.    Appearance: Normal appearance. He is well-developed.  HENT:     Head: Normocephalic and atraumatic.     Right Ear: External ear normal.     Left Ear: External ear normal.  Eyes:     General:        Right eye: No discharge.        Left eye: No discharge.     Conjunctiva/sclera: Conjunctivae normal.  Cardiovascular:     Rate and Rhythm: Normal rate and regular rhythm.  Pulmonary:     Effort: Pulmonary effort is normal. No respiratory distress.     Breath sounds: Normal breath sounds.  Abdominal:     General: Bowel sounds are normal.     Palpations: Abdomen is soft.     Tenderness: There is no abdominal tenderness.  Musculoskeletal:        General: No swelling or tenderness.     Cervical back: Neck supple. No tenderness.  Lymphadenopathy:     Cervical: No cervical adenopathy.  Skin:    Findings: No erythema or rash.  Neurological:      Mental Status: He is alert.  Psychiatric:        Mood and Affect: Mood normal.        Behavior: Behavior normal.     BP 126/74   Pulse 85   Temp 98.2 F (36.8 C)   Resp 16   Wt 198 lb (89.8 kg)   SpO2 99%   BMI 28.41  kg/m  Wt Readings from Last 3 Encounters:  04/22/20 198 lb (89.8 kg)  03/10/20 213 lb (96.6 kg)  03/03/20 213 lb 8 oz (96.8 kg)     Lab Results  Component Value Date   WBC 5.8 02/28/2020   HGB 15.4 02/28/2020   HCT 44.7 02/28/2020   PLT 220.0 02/28/2020   GLUCOSE 93 04/22/2020   CHOL 175 02/28/2020   TRIG 241.0 (H) 02/28/2020   HDL 34.10 (L) 02/28/2020   LDLDIRECT 95.0 02/28/2020   LDLCALC 67 10/04/2019   ALT 17 04/22/2020   AST 13 04/22/2020   NA 138 04/22/2020   K 4.0 04/22/2020   CL 103 04/22/2020   CREATININE 0.94 04/22/2020   BUN 10 04/22/2020   CO2 26 04/22/2020   TSH 0.67 04/22/2020   PSA 0.23 08/28/2019   HGBA1C 5.7 02/28/2020    DG Chest 2 View  Result Date: 08/31/2015 CLINICAL DATA:  Intermittent chest pain for 2 weeks, pain radiates to RIGHT arm. Elevated hypertension. EXAM: CHEST  2 VIEW COMPARISON:  None. FINDINGS: Cardiomediastinal silhouette is normal. Linear densities LEFT lung base. The lungs are otherwise clear without pleural effusions or focal consolidations. Trachea projects midline and there is no pneumothorax. Soft tissue planes and included osseous structures are non-suspicious. Stable mild cortical thickening and focal sclerosis LEFT posterior ninth rib. IMPRESSION: No acute cardiopulmonary process. Minimal LEFT lung base atelectasis/scarring. Electronically Signed   By: Elon Alas M.D.   On: 08/31/2015 23:18       Assessment & Plan:   Problem List Items Addressed This Visit    Anxiety - Primary    Increased anxiety as outlined.  He relates to the celexa.  Stopped two days ago.  No chest pain.  No increased sob.  Feels all symptoms related to anxiety.  Request clonazepam to have to take prn.  Has taken previously  and tolerated.  Check met c and ts.  Follow closely.        Relevant Medications   citalopram (CELEXA) 10 MG tablet   Other Relevant Orders   Comprehensive metabolic panel (Completed)   TSH (Completed)   Hypercholesterolemia    On lipitor.  Has adjusted his diet.  Lost weight. Follow.        Hyperglycemia    Has adjusted diet.  Follow met b and a1c.       Slurred speech    Recently evaluated by neurology.  Recommended 44m aspirin.  Carotid ultrasound.            CEinar Pheasant MD

## 2020-04-23 LAB — COMPREHENSIVE METABOLIC PANEL
ALT: 17 U/L (ref 0–53)
AST: 13 U/L (ref 0–37)
Albumin: 5 g/dL (ref 3.5–5.2)
Alkaline Phosphatase: 69 U/L (ref 39–117)
BUN: 10 mg/dL (ref 6–23)
CO2: 26 mEq/L (ref 19–32)
Calcium: 9.7 mg/dL (ref 8.4–10.5)
Chloride: 103 mEq/L (ref 96–112)
Creatinine, Ser: 0.94 mg/dL (ref 0.40–1.50)
GFR: 85.76 mL/min (ref >60.00)
Glucose, Bld: 93 mg/dL (ref 70–99)
Potassium: 4 mEq/L (ref 3.5–5.1)
Sodium: 138 mEq/L (ref 135–145)
Total Bilirubin: 0.8 mg/dL (ref 0.2–1.2)
Total Protein: 6.9 g/dL (ref 6.0–8.3)

## 2020-04-23 LAB — TSH: TSH: 0.67 u[IU]/mL (ref 0.35–4.50)

## 2020-04-25 ENCOUNTER — Ambulatory Visit: Payer: BC Managed Care – PPO | Admitting: Internal Medicine

## 2020-04-28 ENCOUNTER — Encounter: Payer: Self-pay | Admitting: Internal Medicine

## 2020-04-28 ENCOUNTER — Telehealth: Payer: Self-pay | Admitting: Internal Medicine

## 2020-04-28 NOTE — Assessment & Plan Note (Signed)
Recently evaluated by neurology.  Recommended 81mg  aspirin.  Carotid ultrasound.

## 2020-04-28 NOTE — Assessment & Plan Note (Signed)
Increased anxiety as outlined.  He relates to the celexa.  Stopped two days ago.  No chest pain.  No increased sob.  Feels all symptoms related to anxiety.  Request clonazepam to have to take prn.  Has taken previously and tolerated.  Check met c and ts.  Follow closely.

## 2020-04-28 NOTE — Assessment & Plan Note (Signed)
Has adjusted diet.  Follow met b and a1c.

## 2020-04-28 NOTE — Assessment & Plan Note (Signed)
On lipitor.  Has adjusted his diet.  Lost weight. Follow.

## 2020-04-28 NOTE — Telephone Encounter (Signed)
I had ordered a carotid ultrasound - under vascular ([facility]).  Has not been scheduled yet.  Please schedule.  Thank you.   Dr Lorin Picket

## 2020-05-08 NOTE — Telephone Encounter (Signed)
I spoke with pt he stated he's scheduled to get the VAS US Carotid through [facility]. Please advise and Thank you!

## 2020-05-08 NOTE — Telephone Encounter (Signed)
I just want to make sure I understand that his carotid ultrasound is already scheduled at [facility].  If so, ok.

## 2020-05-13 ENCOUNTER — Ambulatory Visit: Payer: BC Managed Care – PPO | Admitting: Cardiology

## 2020-05-30 NOTE — Telephone Encounter (Signed)
Yes per pt

## 2020-06-15 ENCOUNTER — Other Ambulatory Visit: Payer: Self-pay | Admitting: Internal Medicine

## 2020-06-17 ENCOUNTER — Telehealth (INDEPENDENT_AMBULATORY_CARE_PROVIDER_SITE_OTHER): Payer: BC Managed Care – PPO | Admitting: Internal Medicine

## 2020-06-17 VITALS — Ht 70.0 in | Wt 202.0 lb

## 2020-06-17 DIAGNOSIS — R739 Hyperglycemia, unspecified: Secondary | ICD-10-CM

## 2020-06-17 DIAGNOSIS — I10 Essential (primary) hypertension: Secondary | ICD-10-CM

## 2020-06-17 DIAGNOSIS — E7800 Pure hypercholesterolemia, unspecified: Secondary | ICD-10-CM | POA: Diagnosis not present

## 2020-06-17 DIAGNOSIS — Z125 Encounter for screening for malignant neoplasm of prostate: Secondary | ICD-10-CM

## 2020-06-17 DIAGNOSIS — F419 Anxiety disorder, unspecified: Secondary | ICD-10-CM

## 2020-06-17 NOTE — Progress Notes (Signed)
Patient ID: Fernando Harris, male   DOB: Jan 29, 1971, 49 y.o.   MRN: [id number]   Virtual Visit via telephone Note  This visit type was conducted due to national recommendations for restrictions regarding the COVID-19 pandemic (e.g. social distancing).  This format is felt to be most appropriate for this patient at this time.  All issues noted in this document were discussed and addressed.  No physical exam was performed (except for noted visual exam findings with Video Visits).   I connected with [name] by telephone and verified that I am speaking with the correct person using two identifiers. Location patient: home Location provider: work Persons participating in the telephone visit: patient, provider  The limitations, risks, security and privacy concerns of performing an evaluation and management service by telephone and the availability of in person appointments have been discussed. It has also been discussed with the patient that there may be a patient responsible charge related to this service. The patient expressed understanding and agreed to proceed.   Reason for visit: scheduled follow up.   HPI: Follow up appt regarding increased stress.  Overall he is doing better.  Last visit, he felt anxiety worsened after starting celexa.  Off celexa.  Has been taking clonazepam.  Doing better.  Feels better.  Still with increased stress at work, but feels he is handling things better.  Getting more exercise.  Watching his diet. No chest pain or sob reported.  No abdominal pain or bowel issues reported.  States blood pressures averaging 120s/80s.    ROS: See pertinent positives and negatives per HPI.  Past Medical History:  Diagnosis Date  . Bulging disc    evaluated [facility]  . GERD (gastroesophageal reflux disease)   . Hypertension   . Nephrolithiasis     Past Surgical History:  Procedure Laterality Date  . ELBOW SURGERY    . INGUINAL HERNIA REPAIR    . TONSILLECTOMY       Family History  Problem Relation Age of Onset  . Hypertension Father   . Diabetes Paternal Grandmother   . Heart Problems Paternal Grandmother     SOCIAL HX: reviewed.    Current Outpatient Medications:  .  amLODipine (NORVASC) 5 MG tablet, TAKE 1 TABLET BY MOUTH EVERY DAY, Disp: 90 tablet, Rfl: 1 .  aspirin EC 81 MG tablet, Take 1 tablet (81 mg total) by mouth daily., Disp: 90 tablet, Rfl: 3 .  atorvastatin (LIPITOR) 10 MG tablet, Take 1 tablet (10 mg total) by mouth daily., Disp: 90 tablet, Rfl: 1 .  clonazePAM (KLONOPIN) 0.5 MG tablet, 1/2 - 1 tablet q day prn., Disp: 30 tablet, Rfl: 0 .  [facility] VITAMIN B12 1000 MCG tablet, TAKE 1 TABLET BY MOUTH EVERY DAY, Disp: 90 tablet, Rfl: 1 .  losartan (COZAAR) 50 MG tablet, Take 1 tablet (50 mg total) by mouth daily., Disp: 90 tablet, Rfl: 1 .  omeprazole (PRILOSEC) 40 MG capsule, TAKE 1 CAPSULE BY MOUTH EVERY DAY, Disp: 90 capsule, Rfl: 1  EXAM:  VITALS per patient if applicable: states averaging 120s/80s  GENERAL: alert. Sounds to be in no acute distress.  Answering questions appropriately.    PSYCH/NEURO: pleasant and cooperative, no obvious depression or anxiety, speech and thought processing grossly intact  ASSESSMENT AND PLAN:  Discussed the following assessment and plan:  Hyperglycemia Low carb diet and exercise.  Follow met b and a1c.   Hypercholesterolemia On lipitor.  Low cholesterol diet and exercise.  Follow lipid panel and  liver function tests.    Essential hypertension, benign States blood pressures averaging 120s/80s.  conitnue amlodipine and losartan.  Follow pressures.  Follow metabolic panel.   Anxiety Increased anxiety last visit as outlined.  Off citalopram now.  Felt like the medication was worsening his anxiety.  Has been taking clonazepam prn.  Doing better now.  Feels better.  Exercising more.  Follow.  Try to taper use of clonazepam now that he is doing better.  Follow.     Orders Placed This  Encounter  Procedures  . Hepatic function panel    Standing Status:   Future    Standing Expiration Date:   06/23/2021  . Hemoglobin A1c    Standing Status:   Future    Standing Expiration Date:   06/23/2021  . Lipid panel    Standing Status:   Future    Standing Expiration Date:   06/23/2021  . Basic metabolic panel    Standing Status:   Future    Standing Expiration Date:   06/23/2021  . PSA    Standing Status:   Future    Standing Expiration Date:   06/23/2021     I discussed the assessment and treatment plan with the patient. The patient was provided an opportunity to ask questions and all were answered. The patient agreed with the plan and demonstrated an understanding of the instructions.   The patient was advised to call back or seek an in-person evaluation if the symptoms worsen or if the condition fails to improve as anticipated.  I provided 22 minutes of non-face-to-face time during this encounter.   Einar Pheasant, MD

## 2020-06-17 NOTE — Telephone Encounter (Signed)
Pt has appt today.  Will discuss refill at 06/17/20 appt.

## 2020-06-19 ENCOUNTER — Telehealth: Payer: Self-pay | Admitting: Internal Medicine

## 2020-06-19 MED ORDER — CLONAZEPAM 0.5 MG PO TABS: ORAL_TABLET | ORAL | 0 refills | Status: DC

## 2020-06-19 MED ORDER — OMEPRAZOLE 40 MG PO CPDR: DELAYED_RELEASE_CAPSULE | ORAL | 1 refills | Status: DC

## 2020-06-19 NOTE — Telephone Encounter (Signed)
Pt states that Dr Lorin Picket was going to send over refills for omeprazole (PRILOSEC) 40 MG capsule and clonazePAM (KLONOPIN) 0.5 MG tablet. He states that the pharmacy does not have them. [facility] [address]

## 2020-06-19 NOTE — Telephone Encounter (Signed)
Patient aware.

## 2020-06-19 NOTE — Telephone Encounter (Signed)
rx sent in for omeprazole and clonazepam.

## 2020-06-23 ENCOUNTER — Telehealth: Payer: Self-pay | Admitting: Internal Medicine

## 2020-06-23 ENCOUNTER — Encounter: Payer: Self-pay | Admitting: Internal Medicine

## 2020-06-23 NOTE — Assessment & Plan Note (Signed)
On lipitor.  Low cholesterol diet and exercise.  Follow lipid panel and liver function tests.   

## 2020-06-23 NOTE — Assessment & Plan Note (Signed)
Low carb diet and exercise.  Follow met b and a1c.  

## 2020-06-23 NOTE — Assessment & Plan Note (Signed)
States blood pressures averaging 120s/80s.  conitnue amlodipine and losartan.  Follow pressures.  Follow metabolic panel.

## 2020-06-23 NOTE — Assessment & Plan Note (Signed)
Increased anxiety last visit as outlined.  Off citalopram now.  Felt like the medication was worsening his anxiety.  Has been taking clonazepam prn.  Doing better now.  Feels better.  Exercising more.  Follow.  Try to taper use of clonazepam now that he is doing better.  Follow.

## 2020-06-23 NOTE — Telephone Encounter (Signed)
Scheduled pt

## 2020-06-23 NOTE — Telephone Encounter (Signed)
Please schedule him for a physical in 3 months and fasting labs 1-2 days before physical.  Thanks

## 2020-09-09 ENCOUNTER — Other Ambulatory Visit: Payer: Self-pay | Admitting: Internal Medicine

## 2020-09-20 ENCOUNTER — Other Ambulatory Visit: Payer: Self-pay | Admitting: Internal Medicine

## 2020-09-25 ENCOUNTER — Other Ambulatory Visit (INDEPENDENT_AMBULATORY_CARE_PROVIDER_SITE_OTHER): Payer: BC Managed Care – PPO

## 2020-09-25 ENCOUNTER — Other Ambulatory Visit: Payer: Self-pay

## 2020-09-25 DIAGNOSIS — E7800 Pure hypercholesterolemia, unspecified: Secondary | ICD-10-CM | POA: Diagnosis not present

## 2020-09-25 DIAGNOSIS — R739 Hyperglycemia, unspecified: Secondary | ICD-10-CM

## 2020-09-25 DIAGNOSIS — I10 Essential (primary) hypertension: Secondary | ICD-10-CM

## 2020-09-25 DIAGNOSIS — Z125 Encounter for screening for malignant neoplasm of prostate: Secondary | ICD-10-CM | POA: Diagnosis not present

## 2020-09-25 LAB — HEPATIC FUNCTION PANEL
ALT: 32 U/L (ref 0–53)
AST: 19 U/L (ref 0–37)
Albumin: 4.6 g/dL (ref 3.5–5.2)
Alkaline Phosphatase: 70 U/L (ref 39–117)
Bilirubin, Direct: 0.1 mg/dL (ref 0.0–0.3)
Total Bilirubin: 0.8 mg/dL (ref 0.2–1.2)
Total Protein: 6.6 g/dL (ref 6.0–8.3)

## 2020-09-25 LAB — BASIC METABOLIC PANEL
BUN: 9 mg/dL (ref 6–23)
CO2: 30 mEq/L (ref 19–32)
Calcium: 9.5 mg/dL (ref 8.4–10.5)
Chloride: 103 mEq/L (ref 96–112)
Creatinine, Ser: 1.1 mg/dL (ref 0.40–1.50)
GFR: 79.59 mL/min (ref >60.00)
Glucose, Bld: 97 mg/dL (ref 70–99)
Potassium: 4.3 mEq/L (ref 3.5–5.1)
Sodium: 139 mEq/L (ref 135–145)

## 2020-09-25 LAB — HEMOGLOBIN A1C: Hgb A1c MFr Bld: 5.5 % (ref 4.6–6.5)

## 2020-09-25 LAB — LIPID PANEL
Cholesterol: 144 mg/dL (ref 0–200)
HDL: 34.3 mg/dL — ABNORMAL LOW (ref >39.00)
LDL Cholesterol: 72 mg/dL (ref 0–99)
NonHDL: 109.3
Total CHOL/HDL Ratio: 4
Triglycerides: 186 mg/dL — ABNORMAL HIGH (ref 0.0–149.0)
VLDL: 37.2 mg/dL (ref 0.0–40.0)

## 2020-09-25 LAB — PSA: PSA: 0.25 ng/mL (ref 0.10–4.00)

## 2020-10-01 ENCOUNTER — Encounter: Payer: Self-pay | Admitting: Internal Medicine

## 2020-10-01 ENCOUNTER — Other Ambulatory Visit: Payer: Self-pay

## 2020-10-01 ENCOUNTER — Ambulatory Visit (INDEPENDENT_AMBULATORY_CARE_PROVIDER_SITE_OTHER): Payer: BC Managed Care – PPO | Admitting: Internal Medicine

## 2020-10-01 VITALS — BP 126/80 | HR 87 | Temp 98.6°F | Ht 70.0 in | Wt 214.0 lb

## 2020-10-01 DIAGNOSIS — Z720 Tobacco use: Secondary | ICD-10-CM

## 2020-10-01 DIAGNOSIS — Z23 Encounter for immunization: Secondary | ICD-10-CM | POA: Diagnosis not present

## 2020-10-01 DIAGNOSIS — Z0000 Encounter for general adult medical examination without abnormal findings: Secondary | ICD-10-CM | POA: Diagnosis not present

## 2020-10-01 DIAGNOSIS — E7800 Pure hypercholesterolemia, unspecified: Secondary | ICD-10-CM

## 2020-10-01 DIAGNOSIS — F419 Anxiety disorder, unspecified: Secondary | ICD-10-CM

## 2020-10-01 DIAGNOSIS — I10 Essential (primary) hypertension: Secondary | ICD-10-CM | POA: Diagnosis not present

## 2020-10-01 DIAGNOSIS — Z1211 Encounter for screening for malignant neoplasm of colon: Secondary | ICD-10-CM

## 2020-10-01 DIAGNOSIS — K219 Gastro-esophageal reflux disease without esophagitis: Secondary | ICD-10-CM

## 2020-10-01 DIAGNOSIS — R739 Hyperglycemia, unspecified: Secondary | ICD-10-CM

## 2020-10-01 NOTE — Progress Notes (Signed)
Patient ID: Fernando Harris, male   DOB: [birth year], 48 y.o.   MRN: [id number]   Subjective:    Patient ID: Fernando Harris, male    DOB: 02/22/1971, 49 y.o.   MRN: [id number]  HPI This visit occurred during the SARS-CoV-2 public health emergency.  Safety protocols were in place, including screening questions prior to the visit, additional usage of staff PPE, and extensive cleaning of exam room while observing appropriate contact time as indicated for disinfecting solutions.  Patient here for his physical exam.  He reports he is doing relatively well.  Tries to stay active.  Not exercising regularly.  No chest pain or sob reported.  No abdominal pain or bowel change reported.  Does have acid reflux.  Taking 3m of omeprazole.  If stops the medication, symptoms return 1-2 days after stopping.  Some increased stress recently with his wife's health issues.  Things have leveled off now.  Overall appears to be doing well.  Did start smoking again.  Quit 5 days ago.  Wearing nicotine patch.     Past Medical History:  Diagnosis Date  . Bulging disc    evaluated [facility]  . GERD (gastroesophageal reflux disease)   . Hypertension   . Nephrolithiasis    Past Surgical History:  Procedure Laterality Date  . ELBOW SURGERY    . INGUINAL HERNIA REPAIR    . TONSILLECTOMY     Family History  Problem Relation Age of Onset  . Hypertension Father   . Diabetes Paternal Grandmother   . Heart Problems Paternal Grandmother    Social History   Socioeconomic History  . Marital status: Married    Spouse name: Not on file  . Number of children: Not on file  . Years of education: Not on file  . Highest education level: Not on file  Occupational History  . Not on file  Tobacco Use  . Smoking status: Former Smoker    Packs/day: 1.00    Years: 15.00    Pack years: 15.00    Types: Cigarettes    Quit date: 04/18/2017    Years since quitting: 3.4  . Smokeless tobacco: Former USystems developer   Types:  Snuff    Quit date: 08/21/2013  Substance and Sexual Activity  . Alcohol use: Yes    Alcohol/week: 0.0 standard drinks    Comment: OCCASSIONAL  . Drug use: No  . Sexual activity: Not on file  Other Topics Concern  . Not on file  Social History Narrative   Married    Daughter    Social Determinants of Health   Financial Resource Strain: Not on file  Food Insecurity: Not on file  Transportation Needs: Not on file  Physical Activity: Not on file  Stress: Not on file  Social Connections: Not on file    Outpatient Encounter Medications as of 10/01/2020  Medication Sig  . amLODipine (NORVASC) 5 MG tablet TAKE 1 TABLET BY MOUTH EVERY DAY  . aspirin EC 81 MG tablet Take 1 tablet (81 mg total) by mouth daily.  .Marland Kitchenatorvastatin (LIPITOR) 10 MG tablet TAKE 1 TABLET BY MOUTH EVERY DAY  . [facility] VITAMIN B12 1000 MCG tablet TAKE 1 TABLET BY MOUTH EVERY DAY  . omeprazole (PRILOSEC) 20 MG capsule Take 20 mg by mouth daily.  .Marland Kitchenlosartan (COZAAR) 50 MG tablet Take 1 tablet (50 mg total) by mouth daily.  . [DISCONTINUED] clonazePAM (KLONOPIN) 0.5 MG tablet 1/2 - 1 tablet q day prn. (Patient not  taking: Reported on 10/01/2020)  . [DISCONTINUED] omeprazole (PRILOSEC) 40 MG capsule TAKE 1 CAPSULE BY MOUTH EVERY DAY (Patient not taking: Reported on 10/01/2020)   No facility-administered encounter medications on file as of 10/01/2020.    Review of Systems  Constitutional: Negative for appetite change and unexpected weight change.  HENT: Negative for congestion, sinus pressure and sore throat.   Eyes: Negative for pain and visual disturbance.  Respiratory: Negative for cough, chest tightness and shortness of breath.   Cardiovascular: Negative for chest pain, palpitations and leg swelling.  Gastrointestinal: Negative for abdominal pain, diarrhea, nausea and vomiting.  Genitourinary: Negative for difficulty urinating and dysuria.  Musculoskeletal: Negative for joint swelling and myalgias.  Skin: Negative  for color change and rash.  Neurological: Negative for dizziness, light-headedness and headaches.  Hematological: Negative for adenopathy. Does not bruise/bleed easily.  Psychiatric/Behavioral: Negative for agitation and dysphoric mood.       Objective:    Physical Exam Vitals reviewed.  Constitutional:      General: He is not in acute distress.    Appearance: Normal appearance. He is well-developed and well-nourished.  HENT:     Head: Normocephalic and atraumatic.     Right Ear: External ear normal.     Left Ear: External ear normal.     Mouth/Throat:     Mouth: Oropharynx is clear and moist.     Pharynx: No oropharyngeal exudate.  Eyes:     General:        Right eye: No discharge.        Left eye: No discharge.     Conjunctiva/sclera: Conjunctivae normal.  Neck:     Thyroid: No thyromegaly.  Cardiovascular:     Rate and Rhythm: Normal rate and regular rhythm.  Pulmonary:     Effort: No respiratory distress.     Breath sounds: Normal breath sounds. No wheezing.  Abdominal:     General: Bowel sounds are normal.     Palpations: Abdomen is soft.     Tenderness: There is no abdominal tenderness.  Genitourinary:    Penis: Normal.      Rectum: Normal.  Musculoskeletal:        General: No swelling, tenderness or edema.     Cervical back: Neck supple. No tenderness.  Lymphadenopathy:     Cervical: No cervical adenopathy.  Skin:    Findings: No erythema or rash.  Neurological:     Mental Status: He is alert and oriented to person, place, and time.  Psychiatric:        Mood and Affect: Mood and affect and mood normal.        Behavior: Behavior normal.     BP 126/80 (BP Location: Left Arm, Patient Position: Sitting, Cuff Size: Normal)   Pulse 87   Temp 98.6 F (37 C) (Oral)   Ht 5' 10"  (1.778 m)   Wt 214 lb (97.1 kg)   SpO2 97%   BMI 30.71 kg/m  Wt Readings from Last 3 Encounters:  10/01/20 214 lb (97.1 kg)  06/17/20 202 lb (91.6 kg)  04/22/20 198 lb (89.8 kg)      Lab Results  Component Value Date   WBC 5.8 02/28/2020   HGB 15.4 02/28/2020   HCT 44.7 02/28/2020   PLT 220.0 02/28/2020   GLUCOSE 97 09/25/2020   CHOL 144 09/25/2020   TRIG 186.0 (H) 09/25/2020   HDL 34.30 (L) 09/25/2020   LDLDIRECT 95.0 02/28/2020   LDLCALC 72 09/25/2020   ALT 32  09/25/2020   AST 19 09/25/2020   NA 139 09/25/2020   K 4.3 09/25/2020   CL 103 09/25/2020   CREATININE 1.10 09/25/2020   BUN 9 09/25/2020   CO2 30 09/25/2020   TSH 0.67 04/22/2020   PSA 0.25 09/25/2020   HGBA1C 5.5 09/25/2020    DG Chest 2 View  Result Date: 08/31/2015 CLINICAL DATA:  Intermittent chest pain for 2 weeks, pain radiates to RIGHT arm. Elevated hypertension. EXAM: CHEST  2 VIEW COMPARISON:  None. FINDINGS: Cardiomediastinal silhouette is normal. Linear densities LEFT lung base. The lungs are otherwise clear without pleural effusions or focal consolidations. Trachea projects midline and there is no pneumothorax. Soft tissue planes and included osseous structures are non-suspicious. Stable mild cortical thickening and focal sclerosis LEFT posterior ninth rib. IMPRESSION: No acute cardiopulmonary process. Minimal LEFT lung base atelectasis/scarring. Electronically Signed   By: Elon Alas M.D.   On: 08/31/2015 23:18       Assessment & Plan:   Problem List Items Addressed This Visit    Tobacco abuse    Started smoking again.  Quit 5 days ago.  Wearing nicotine patch. Not smoking now.  Follow.       Hyperglycemia    Low carb diet and exercise. Follow met b and a1c.       Relevant Orders   Hemoglobin A1c   Hypercholesterolemia    On lipitor.  Low cholesterol diet and exercise.  Follow lipid panel and liver function tests.        Relevant Orders   Hepatic function panel   Lipid panel   Health care maintenance    Physical today 10/01/20.  Refer to GI - colonoscopy.  PSA .25 09/25/20.        GERD (gastroesophageal reflux disease)    Taking prilosec.  When stopped,  symptoms return in 1-2 days.  Controlled on medication.  Given immediate return of symptoms when stop prilosec, will refer to GI for evaluation and question of need for EGD.       Relevant Medications   omeprazole (PRILOSEC) 20 MG capsule   Other Relevant Orders   Ambulatory referral to Gastroenterology   Essential hypertension, benign    Blood pressure as outlined.  Continue amlodipine and losartan.  Follow pressures.  Follow metabolic panel.       Relevant Orders   Basic metabolic panel   Anxiety    Doing better.  On no medication now.  Follow.         Other Visit Diagnoses    Routine general medical examination at a health care facility    -  Primary   Need for immunization against influenza       Relevant Orders   Flu Vaccine QUAD 36+ mos IM (Completed)   Colon cancer screening       Relevant Orders   Ambulatory referral to Gastroenterology       Einar Pheasant, MD

## 2020-10-06 ENCOUNTER — Encounter: Payer: Self-pay | Admitting: Internal Medicine

## 2020-10-06 DIAGNOSIS — K219 Gastro-esophageal reflux disease without esophagitis: Secondary | ICD-10-CM | POA: Insufficient documentation

## 2020-10-06 NOTE — Assessment & Plan Note (Signed)
Physical today 10/01/20.  Refer to GI - colonoscopy.  PSA .25 09/25/20.

## 2020-10-06 NOTE — Assessment & Plan Note (Signed)
Blood pressure as outlined. Continue amlodipine and losartan.  Follow pressures.  Follow metabolic panel.  

## 2020-10-06 NOTE — Assessment & Plan Note (Signed)
Taking prilosec.  When stopped, symptoms return in 1-2 days.  Controlled on medication.  Given immediate return of symptoms when stop prilosec, will refer to GI for evaluation and question of need for EGD.

## 2020-10-06 NOTE — Assessment & Plan Note (Signed)
Low carb diet and exercise. Follow met b and a1c.  

## 2020-10-06 NOTE — Assessment & Plan Note (Signed)
Doing better.  On no medication now.  Follow.   

## 2020-10-06 NOTE — Assessment & Plan Note (Signed)
Started smoking again.  Quit 5 days ago.  Wearing nicotine patch. Not smoking now.  Follow.

## 2020-10-06 NOTE — Assessment & Plan Note (Signed)
On lipitor.  Low cholesterol diet and exercise.  Follow lipid panel and liver function tests.   

## 2020-10-13 ENCOUNTER — Other Ambulatory Visit: Payer: Self-pay | Admitting: *Deleted

## 2020-10-13 MED ORDER — LOSARTAN POTASSIUM 50 MG PO TABS: 50.0000 mg | ORAL_TABLET | Freq: Every day | ORAL | 0 refills | Status: DC

## 2020-10-14 ENCOUNTER — Other Ambulatory Visit: Payer: Self-pay

## 2020-11-12 ENCOUNTER — Other Ambulatory Visit: Payer: Self-pay | Admitting: *Deleted

## 2020-11-12 MED ORDER — LOSARTAN POTASSIUM 50 MG PO TABS: 50.0000 mg | ORAL_TABLET | Freq: Every day | ORAL | 0 refills | Status: DC

## 2020-11-24 ENCOUNTER — Telehealth: Payer: Self-pay | Admitting: Cardiology

## 2020-11-24 MED ORDER — LOSARTAN POTASSIUM 50 MG PO TABS: 50.0000 mg | ORAL_TABLET | Freq: Every day | ORAL | 1 refills | Status: DC

## 2020-11-24 NOTE — Telephone Encounter (Signed)
Received fax from [facility] on [address] requesting refills for Losartan 50 mg. Rx request sent to pharmacy.

## 2020-11-25 ENCOUNTER — Encounter: Payer: Self-pay | Admitting: Gastroenterology

## 2020-11-25 ENCOUNTER — Other Ambulatory Visit: Payer: Self-pay

## 2020-11-25 ENCOUNTER — Ambulatory Visit: Payer: BC Managed Care – PPO | Admitting: Gastroenterology

## 2020-11-25 VITALS — BP 145/95 | HR 83 | Temp 97.6°F | Ht 70.0 in | Wt 218.0 lb

## 2020-11-25 DIAGNOSIS — K219 Gastro-esophageal reflux disease without esophagitis: Secondary | ICD-10-CM

## 2020-11-25 DIAGNOSIS — Z1211 Encounter for screening for malignant neoplasm of colon: Secondary | ICD-10-CM

## 2020-11-25 MED ORDER — NA SULFATE-K SULFATE-MG SULF 17.5-3.13-1.6 GM/177ML PO SOLN: ORAL | 0 refills | Status: DC

## 2020-11-25 NOTE — Patient Instructions (Signed)
Gastroesophageal Reflux Disease, Adult  Gastroesophageal reflux (GER) happens when acid from the stomach flows up into the tube that connects the mouth and the stomach (esophagus). Normally, food travels down the esophagus and stays in the stomach to be digested. With GER, food and stomach acid sometimes move back up into the esophagus. You may have a disease called gastroesophageal reflux disease (GERD) if the reflux:  Happens often.  Causes frequent or very bad symptoms.  Causes problems such as damage to the esophagus. When this happens, the esophagus becomes sore and swollen. Over time, GERD can make small holes (ulcers) in the lining of the esophagus. What are the causes? This condition is caused by a problem with the muscle between the esophagus and the stomach. When this muscle is weak or not normal, it does not close properly to keep food and acid from coming back up from the stomach. The muscle can be weak because of:  Tobacco use.  Pregnancy.  Having a certain type of hernia (hiatal hernia).  Alcohol use.  Certain foods and drinks, such as coffee, chocolate, onions, and peppermint. What increases the risk?  Being overweight.  Having a disease that affects your connective tissue.  Taking NSAIDs, such a ibuprofen. What are the signs or symptoms?  Heartburn.  Difficult or painful swallowing.  The feeling of having a lump in the throat.  A bitter taste in the mouth.  Bad breath.  Having a lot of saliva.  Having an upset or bloated stomach.  Burping.  Chest pain. Different conditions can cause chest pain. Make sure you see your doctor if you have chest pain.  Shortness of breath or wheezing.  A long-term cough or a cough at night.  Wearing away of the surface of teeth (tooth enamel).  Weight loss. How is this treated?  Making changes to your diet.  Taking medicine.  Having surgery. Treatment will depend on how bad your symptoms are. Follow these  instructions at home: Eating and drinking  Follow a diet as told by your doctor. You may need to avoid foods and drinks such as: ? Coffee and tea, with or without caffeine. ? Drinks that contain alcohol. ? Energy drinks and sports drinks. ? Bubbly (carbonated) drinks or sodas. ? Chocolate and cocoa. ? Peppermint and mint flavorings. ? Garlic and onions. ? Horseradish. ? Spicy and acidic foods. These include peppers, chili powder, curry powder, vinegar, hot sauces, and BBQ sauce. ? Citrus fruit juices and citrus fruits, such as oranges, lemons, and limes. ? Tomato-based foods. These include red sauce, chili, salsa, and pizza with red sauce. ? Fried and fatty foods. These include donuts, french fries, potato chips, and high-fat dressings. ? High-fat meats. These include hot dogs, rib eye steak, sausage, ham, and bacon. ? High-fat dairy items, such as whole milk, butter, and cream cheese.  Eat small meals often. Avoid eating large meals.  Avoid drinking large amounts of liquid with your meals.  Avoid eating meals during the 2-3 hours before bedtime.  Avoid lying down right after you eat.  Do not exercise right after you eat.   Lifestyle  Do not smoke or use any products that contain nicotine or tobacco. If you need help quitting, ask your doctor.  Try to lower your stress. If you need help doing this, ask your doctor.  If you are overweight, lose an amount of weight that is healthy for you. Ask your doctor about a safe weight loss goal.   General instructions    Pay attention to any changes in your symptoms.  Take over-the-counter and prescription medicines only as told by your doctor.  Do not take aspirin, ibuprofen, or other NSAIDs unless your doctor says it is okay.  Wear loose clothes. Do not wear anything tight around your waist.  Raise (elevate) the head of your bed about 6 inches (15 cm). You may need to use a wedge to do this.  Avoid bending over if this makes your  symptoms worse.  Keep all follow-up visits. Contact a doctor if:  You have new symptoms.  You lose weight and you do not know why.  You have trouble swallowing or it hurts to swallow.  You have wheezing or a cough that keeps happening.  You have a hoarse voice.  Your symptoms do not get better with treatment. Get help right away if:  You have sudden pain in your arms, neck, jaw, teeth, or back.  You suddenly feel sweaty, dizzy, or light-headed.  You have chest pain or shortness of breath.  You vomit and the vomit is green, yellow, or black, or it looks like blood or coffee grounds.  You faint.  Your poop (stool) is red, bloody, or black.  You cannot swallow, drink, or eat. These symptoms may represent a serious problem that is an emergency. Do not wait to see if the symptoms will go away. Get medical help right away. Call your local emergency services (911 in the [place] Do not drive yourself to the hospital. Summary  If a person has gastroesophageal reflux disease (GERD), food and stomach acid move back up into the esophagus and cause symptoms or problems such as damage to the esophagus.  Treatment will depend on how bad your symptoms are.  Follow a diet as told by your doctor.  Take all medicines only as told by your doctor. This information is not intended to replace advice given to you by your health care provider. Make sure you discuss any questions you have with your health care provider. Document Revised: 04/21/2020 Document Reviewed: 04/21/2020 Elsevier Patient Education  2021 Elsevier Inc.  

## 2020-11-26 NOTE — Progress Notes (Signed)
[facility]odie [facility] [address]  [address]  [place], [place] [address]  Main: [phone]  Fax: [phone]   Gastroenterology Consultation  Referring Provider:     Dale [place], MD Primary Care Physician:  Dale Tucumcari, MD Reason for Consultation:     Reflux        HPI:    Chief Complaint  Patient presents with  . Gastroesophageal Reflux    Fernando Harris is a 50 y.o. y/o male referred for consultation & management  by Dr. Lorin Picket, Westley Hummer, MD.  Patient reports being on omeprazole for the last 10 years as reports having severe indigestion symptoms prior to this.  As long as he does not miss a dose, he remains asymptomatic.  He states if he misses a dose, he has severe indigestion the next day.  no nausea or vomiting, no weight loss.  No family history of colon cancer.  No prior EGD or colonoscopy.  Past Medical History:  Diagnosis Date  . Bulging disc    evaluated [facility]  . GERD (gastroesophageal reflux disease)   . Hypertension   . Nephrolithiasis     Past Surgical History:  Procedure Laterality Date  . ELBOW SURGERY    . INGUINAL HERNIA REPAIR    . TONSILLECTOMY      Prior to Admission medications   Medication Sig Start Date End Date Taking? Authorizing Provider  amLODipine (NORVASC) 5 MG tablet TAKE 1 TABLET BY MOUTH EVERY DAY 09/22/20  Yes Dale [place], MD  aspirin EC 81 MG tablet Take 1 tablet (81 mg total) by mouth daily. 03/03/20  Yes Agbor-Etang, Arlys John, MD  atorvastatin (LIPITOR) 10 MG tablet TAKE 1 TABLET BY MOUTH EVERY DAY 09/09/20  Yes Dale [place], MD  losartan (COZAAR) 50 MG tablet Take 1 tablet (50 mg total) by mouth daily. 11/24/20 12/24/20 Yes Agbor-Etang, Arlys John, MD  Na Sulfate-K Sulfate-Mg Sulf 17.5-3.13-1.6 GM/177ML SOLN At 5 PM the day before procedure take 1 bottle and 5 hours before procedure take 1 bottle. 11/25/20  Yes Pasty Spillers, MD  omeprazole (PRILOSEC) 20 MG capsule Take 20 mg by mouth daily.   Yes  [provider]  [facility] VITAMIN B12 1000 MCG tablet TAKE 1 TABLET BY MOUTH EVERY DAY Patient not taking: Reported on 11/25/2020 04/13/18   Dale Davison, MD    Family History  Problem Relation Age of Onset  . Hypertension Father   . Diabetes Paternal Grandmother   . Heart Problems Paternal Grandmother      Social History   Tobacco Use  . Smoking status: Former Smoker    Packs/day: 1.00    Years: 15.00    Pack years: 15.00    Types: Cigarettes    Quit date: 04/18/2017    Years since quitting: 3.6  . Smokeless tobacco: Former Neurosurgeon    Types: Snuff    Quit date: 08/21/2013  Substance Use Topics  . Alcohol use: Yes    Alcohol/week: 0.0 standard drinks    Comment: OCCASSIONAL  . Drug use: No    Allergies as of 11/25/2020 - Review Complete 11/25/2020  Allergen Reaction Noted  . Aleve [naproxen] Swelling 04/18/2013    Review of Systems:    All systems reviewed and negative except where noted in HPI.   Physical Exam:  BP (!) 145/95   Pulse 83   Temp 97.6 F (36.4 C) (Oral)   Ht 5\' 10"  (1.778 m)   Wt 218 lb (98.9 kg)   BMI 31.28 kg/m  No LMP for male patient. Psych:  Alert and cooperative. Normal mood and affect. General:   Alert,  Well-developed, well-nourished, pleasant and cooperative in NAD Head:  Normocephalic and atraumatic. Eyes:  Sclera clear, no icterus.   Conjunctiva pink. Ears:  Normal auditory acuity. Nose:  No deformity, discharge, or lesions. Mouth:  No deformity or lesions,oropharynx pink & moist. Neck:  Supple; no masses or thyromegaly. Abdomen:  Normal bowel sounds.  No bruits.  Soft, non-tender and non-distended without masses, hepatosplenomegaly or hernias noted.  No guarding or rebound tenderness.    Msk:  Symmetrical without gross deformities. Good, equal movement & strength bilaterally. Pulses:  Normal pulses noted. Extremities:  No clubbing or edema.  No cyanosis. Neurologic:  Alert and oriented x3;  grossly normal neurologically. Skin:   Intact without significant lesions or rashes. No jaundice. Lymph Nodes:  No significant cervical adenopathy. Psych:  Alert and cooperative. Normal mood and affect.   Labs: CBC    Component Value Date/Time   WBC 5.8 02/28/2020 0846   RBC 5.04 02/28/2020 0846   HGB 15.4 02/28/2020 0846   HCT 44.7 02/28/2020 0846   PLT 220.0 02/28/2020 0846   MCV 88.8 02/28/2020 0846   MCH 29.8 08/31/2015 2155   MCHC 34.3 02/28/2020 0846   RDW 12.8 02/28/2020 0846   LYMPHSABS 1.5 02/28/2020 0846   MONOABS 0.5 02/28/2020 0846   EOSABS 0.1 02/28/2020 0846   BASOSABS 0.0 02/28/2020 0846   CMP     Component Value Date/Time   NA 139 09/25/2020 0802   K 4.3 09/25/2020 0802   CL 103 09/25/2020 0802   CO2 30 09/25/2020 0802   GLUCOSE 97 09/25/2020 0802   BUN 9 09/25/2020 0802   CREATININE 1.10 09/25/2020 0802   CALCIUM 9.5 09/25/2020 0802   PROT 6.6 09/25/2020 0802   ALBUMIN 4.6 09/25/2020 0802   AST 19 09/25/2020 0802   ALT 32 09/25/2020 0802   ALKPHOS 70 09/25/2020 0802   BILITOT 0.8 09/25/2020 0802   GFRNONAA >60 08/31/2015 2155   GFRAA >60 08/31/2015 2155    Imaging Studies: No results found.  Assessment and Plan:   Fernando Harris is a 50 y.o. y/o male has been referred for GERD  We discussed adverse effects of PPI and lifestyle modifications to try to come off the medication and he is willing to try these  Patient educated extensively on acid reflux lifestyle modification, including buying a bed wedge, not eating 3 hrs before bedtime, diet modifications, and handout given for the same.   He takes omeprazole over-the-counter and after instituting lifestyle measures I have advised him to discontinue the omeprazole and if symptoms return to call us back.  Alternatives would include starting him on once daily Pepcid instead and if symptoms are controlled, continuing it for 1 to 2 months and then trying to discontinue the as well  (Risks of PPI use were discussed with patient including  bone loss, C. Diff diarrhea, pneumonia, infections, CKD, electrolyte abnormalities.  Pt. Verbalizes understanding and chooses to continue the medication.)  Given ongoing symptoms requiring PPI use over 10 years, EGD would help evaluate for Barrett's and rule out any underlying lesions  Patient also due for screening colonoscopy  I have discussed alternative options, risks & benefits,  which include, but are not limited to, bleeding, infection, perforation,respiratory complication & drug reaction.  The patient agrees with this plan & written consent will be obtained.       Dr Melodie Bouillon  Speech  recognition software was used to dictate the above note.

## 2020-11-27 ENCOUNTER — Telehealth: Payer: Self-pay

## 2020-11-27 NOTE — Telephone Encounter (Signed)
Patient lvm stating that he has been experiencing COVID symptoms and just wanted to let us know.  His procedure is scheduled on 12/04/20.  He inquired as to if he should reschedule his procedure at this time.  Call returned lvm advising patient that he can reschedule his procedure now, or he can wait and have his COVID test as scheduled prior to his procedures.  If it comes back positive we can reschedule at that time.  Will await call back from patient to see if he would like to reschedule.  Thanks,  [place], [place]

## 2020-11-28 ENCOUNTER — Telehealth: Payer: Self-pay

## 2020-11-28 NOTE — Telephone Encounter (Signed)
Patients call has been returned in regard to rescheduling his procedure with Dr. Alessandra Bevels. He states he did test positive and needs to reschedule from 12/04/20.  Will await call back to reschedule.  Thanks,  [place], [place]

## 2020-12-01 ENCOUNTER — Telehealth: Payer: Self-pay | Admitting: Gastroenterology

## 2020-12-01 NOTE — Telephone Encounter (Signed)
Fernando Harris will contact the patient to let him know that his procedure will need to be rescheduled.

## 2020-12-01 NOTE — Telephone Encounter (Signed)
Patient has Covid, needs to reschedule Colonoscopy scheduled for 12/04/20

## 2020-12-02 ENCOUNTER — Other Ambulatory Visit: Payer: Self-pay

## 2020-12-02 ENCOUNTER — Other Ambulatory Visit
Admission: RE | Admit: 2020-12-02 | Discharge: 2020-12-02 | Disposition: A | Discharge status: Home / Self Care | Payer: BC Managed Care – PPO | Source: Ambulatory Visit | Attending: Gastroenterology | Admitting: Gastroenterology

## 2020-12-02 DIAGNOSIS — Z01812 Encounter for preprocedural laboratory examination: Secondary | ICD-10-CM | POA: Insufficient documentation

## 2020-12-02 DIAGNOSIS — U071 COVID-19: Secondary | ICD-10-CM | POA: Diagnosis not present

## 2020-12-02 LAB — SARS CORONAVIRUS 2 (TAT 6-24 HRS): SARS Coronavirus 2: POSITIVE — AB

## 2020-12-03 ENCOUNTER — Telehealth: Payer: Self-pay

## 2020-12-03 ENCOUNTER — Telehealth: Payer: Self-pay | Admitting: *Deleted

## 2020-12-03 NOTE — Telephone Encounter (Signed)
Patients Colonoscopy w/EGD has been rescheduled from 12/04/20 to 12/25/20 due to positive COVID test.  Patient has asked to not have his EGD performed due to insurance will not cover it.  He does want to keep the Colonoscopy as we have it scheduled for 12/25/20.  Thanks,  [place], [place]

## 2020-12-03 NOTE — Telephone Encounter (Signed)
Called to discuss with patient about COVID-19 symptoms and the use of one of the available treatments for those with mild to moderate Covid symptoms and at a high risk of hospitalization.  Pt appears to qualify for outpatient treatment due to co-morbid conditions and/or a member of an at-risk group in accordance with the FDA Emergency Use Authorization.    Symptom onset: 7-8 days ago.  Vaccinated: yes Booster? no Immunocompromised?  Qualifiers:      Fernando Harris

## 2020-12-24 ENCOUNTER — Encounter: Payer: Self-pay | Admitting: Gastroenterology

## 2020-12-25 ENCOUNTER — Ambulatory Visit: Payer: BC Managed Care – PPO | Admitting: Anesthesiology

## 2020-12-25 ENCOUNTER — Encounter
Admission: RE | Disposition: A | Discharge status: Home / Self Care | Payer: Self-pay | Source: Home / Self Care | Attending: Gastroenterology

## 2020-12-25 ENCOUNTER — Ambulatory Visit
Admission: RE | Admit: 2020-12-25 | Discharge: 2020-12-25 | Disposition: A | Discharge status: Home / Self Care | Payer: BC Managed Care – PPO | Attending: Gastroenterology | Admitting: Gastroenterology

## 2020-12-25 ENCOUNTER — Other Ambulatory Visit: Payer: Self-pay

## 2020-12-25 ENCOUNTER — Encounter: Payer: Self-pay | Admitting: Gastroenterology

## 2020-12-25 DIAGNOSIS — K648 Other hemorrhoids: Secondary | ICD-10-CM | POA: Diagnosis not present

## 2020-12-25 DIAGNOSIS — Z1211 Encounter for screening for malignant neoplasm of colon: Secondary | ICD-10-CM | POA: Diagnosis not present

## 2020-12-25 DIAGNOSIS — K5790 Diverticulosis of intestine, part unspecified, without perforation or abscess without bleeding: Secondary | ICD-10-CM | POA: Diagnosis not present

## 2020-12-25 DIAGNOSIS — K644 Residual hemorrhoidal skin tags: Secondary | ICD-10-CM | POA: Diagnosis not present

## 2020-12-25 DIAGNOSIS — K219 Gastro-esophageal reflux disease without esophagitis: Secondary | ICD-10-CM | POA: Diagnosis not present

## 2020-12-25 DIAGNOSIS — Z7982 Long term (current) use of aspirin: Secondary | ICD-10-CM | POA: Insufficient documentation

## 2020-12-25 DIAGNOSIS — Z87891 Personal history of nicotine dependence: Secondary | ICD-10-CM | POA: Diagnosis not present

## 2020-12-25 DIAGNOSIS — Z886 Allergy status to analgesic agent status: Secondary | ICD-10-CM | POA: Diagnosis not present

## 2020-12-25 DIAGNOSIS — K5730 Diverticulosis of large intestine without perforation or abscess without bleeding: Secondary | ICD-10-CM | POA: Diagnosis not present

## 2020-12-25 DIAGNOSIS — Z79899 Other long term (current) drug therapy: Secondary | ICD-10-CM | POA: Insufficient documentation

## 2020-12-25 HISTORY — PX: COLONOSCOPY WITH PROPOFOL: SHX5780

## 2020-12-25 SURGERY — COLONOSCOPY WITH PROPOFOL
Anesthesia: General

## 2020-12-25 MED ORDER — LIDOCAINE HCL (PF) 2 % IJ SOLN
INTRAMUSCULAR | Status: AC
  Filled 2020-12-25: qty 5

## 2020-12-25 MED ORDER — PROPOFOL 10 MG/ML IV BOLUS
INTRAVENOUS | Status: AC
  Filled 2020-12-25: qty 20

## 2020-12-25 MED ORDER — PROPOFOL 500 MG/50ML IV EMUL
INTRAVENOUS | Status: DC | PRN
  Administered 2020-12-25: 150 ug/kg/min via INTRAVENOUS

## 2020-12-25 MED ORDER — SODIUM CHLORIDE 0.9 % IV SOLN: INTRAVENOUS | Status: DC

## 2020-12-25 MED ORDER — LIDOCAINE HCL (CARDIAC) PF 100 MG/5ML IV SOSY
PREFILLED_SYRINGE | INTRAVENOUS | Status: DC | PRN
  Administered 2020-12-25: 50 mg via INTRAVENOUS

## 2020-12-25 MED ORDER — PROPOFOL 10 MG/ML IV BOLUS
INTRAVENOUS | Status: DC | PRN
  Administered 2020-12-25: 80 mg via INTRAVENOUS
  Administered 2020-12-25: 20 mg via INTRAVENOUS

## 2020-12-25 MED ORDER — PROPOFOL 500 MG/50ML IV EMUL
INTRAVENOUS | Status: AC
  Filled 2020-12-25: qty 50

## 2020-12-25 MED ORDER — PHENYLEPHRINE HCL (PRESSORS) 10 MG/ML IV SOLN
INTRAVENOUS | Status: AC
  Filled 2020-12-25: qty 1

## 2020-12-25 NOTE — Anesthesia Procedure Notes (Signed)
Date/Time: 12/25/2020 8:36 AM Performed by: Ginger Carne, CRNA Pre-anesthesia Checklist: Patient identified, Emergency Drugs available, Suction available, Patient being monitored and Timeout performed Patient Re-evaluated:Patient Re-evaluated prior to induction Oxygen Delivery Method: Nasal cannula Preoxygenation: Pre-oxygenation with 100% oxygen Induction Type: IV induction

## 2020-12-25 NOTE — Anesthesia Preprocedure Evaluation (Signed)
Anesthesia Evaluation  Patient identified by MRN, date of birth, ID band Patient awake    Reviewed: Allergy & Precautions, NPO status , Patient's Chart, lab work & pertinent test results  History of Anesthesia Complications Negative for: history of anesthetic complications  Airway Mallampati: II       Dental   Pulmonary neg sleep apnea, neg COPD, former smoker,           Cardiovascular hypertension, Pt. on medications (-) Past MI and (-) CHF (-) dysrhythmias (-) Valvular Problems/Murmurs     Neuro/Psych neg Seizures Anxiety    GI/Hepatic Neg liver ROS, GERD  Medicated and Controlled,  Endo/Other  neg diabetes  Renal/GU Renal disease (stones)     Musculoskeletal   Abdominal   Peds  Hematology   Anesthesia Other Findings   Reproductive/Obstetrics                             Anesthesia Physical Anesthesia Plan  ASA: II  Anesthesia Plan: General   Post-op Pain Management:    Induction: Intravenous  PONV Risk Score and Plan: 2 and Propofol infusion and TIVA  Airway Management Planned: Nasal Cannula  Additional Equipment:   Intra-op Plan:   Post-operative Plan:   Informed Consent: I have reviewed the patients History and Physical, chart, labs and discussed the procedure including the risks, benefits and alternatives for the proposed anesthesia with the patient or authorized representative who has indicated his/her understanding and acceptance.       Plan Discussed with:   Anesthesia Plan Comments:         Anesthesia Quick Evaluation

## 2020-12-25 NOTE — Transfer of Care (Signed)
Immediate Anesthesia Transfer of Care Note  Patient: Fernando Harris  Procedure(s) Performed: COLONOSCOPY WITH PROPOFOL (N/A )  Patient Location: PACU  Anesthesia Type:General  Level of Consciousness: sedated  Airway & Oxygen Therapy: Patient Spontanous Breathing  Post-op Assessment: Report given to RN and Post -op Vital signs reviewed and stable  Post vital signs: Reviewed and stable  Last Vitals:  Vitals Value Taken Time  BP 105/79 12/25/20 0905  Temp    Pulse 78 12/25/20 0905  Resp 16 12/25/20 0905  SpO2 96 % 12/25/20 0905    Last Pain:  Vitals:   12/25/20 0722  TempSrc: Temporal  PainSc: 0-No pain         Complications: No complications documented.

## 2020-12-25 NOTE — Op Note (Signed)
[facility] Patient Name: Fernando Harris Procedure Date: 12/25/2020 8:35 AM MRN: [id number] Account #: [id number] Date of Birth: Dec 19, 1970 Admit Type: Outpatient Age: 50 Room: [facility] ENDO ROOM 2 Gender: Male Note Status: Finalized Procedure:             Colonoscopy Indications:           Screening for colorectal malignant neoplasm Providers:             Cleota Pellerito B. Maximino Greenland MD, MD Referring MD:          Dale [place], MD (Referring MD) Medicines:             Monitored Anesthesia Care Complications:         No immediate complications. Procedure:             Pre-Anesthesia Assessment:                        - Prior to the procedure, a History and Physical was                         performed, and patient medications, allergies and                         sensitivities were reviewed. The patient's tolerance                         of previous anesthesia was reviewed.                        - The risks and benefits of the procedure and the                         sedation options and risks were discussed with the                         patient. All questions were answered and informed                         consent was obtained.                        - Patient identification and proposed procedure were                         verified prior to the procedure by the physician, the                         nurse, the anesthetist and the technician. The                         procedure was verified in the pre-procedure area in                         the procedure room in the endoscopy suite.                        - ASA Grade Assessment: II - A patient with mild  systemic disease.                        - After reviewing the risks and benefits, the patient                         was deemed in satisfactory condition to undergo the                         procedure.                        After obtaining informed consent, the  colonoscope was                         passed under direct vision. Throughout the procedure,                         the patient's blood pressure, pulse, and oxygen                         saturations were monitored continuously. The                         Colonoscope was introduced through the anus and                         advanced to the the cecum, identified by appendiceal                         orifice and ileocecal valve. The colonoscopy was                         performed with ease. The patient tolerated the                         procedure well. The quality of the bowel preparation                         was good. Findings:      Hemorrhoids were found on perianal exam.      A few small and large-mouthed diverticula were found in the transverse       colon and ascending colon.      Multiple diverticula were found in the sigmoid colon.      The exam was otherwise without abnormality.      The rectum, sigmoid colon, descending colon, transverse colon, ascending       colon and cecum appeared normal.      Non-bleeding internal hemorrhoids were found during retroflexion. Impression:            - Hemorrhoids found on perianal exam.                        - Diverticulosis in the transverse colon and in the                         ascending colon.                        - Diverticulosis in the sigmoid colon.                        -  The examination was otherwise normal.                        - The rectum, sigmoid colon, descending colon,                         transverse colon, ascending colon and cecum are normal.                        - Non-bleeding internal hemorrhoids.                        - No specimens collected. Recommendation:        - Discharge patient to home.                        - Resume previous diet.                        - Continue present medications.                        - Repeat colonoscopy in 10 years for screening                          purposes.                        - Return to primary care physician as previously                         scheduled.                        - The findings and recommendations were discussed with                         the patient.                        - The findings and recommendations were discussed with                         the patient's family.                        - High fiber diet. Procedure Code(s):     --- Professional ---                        [phone], Colonoscopy, flexible; diagnostic, including                         collection of specimen(s) by brushing or washing, when                         performed (separate procedure) Diagnosis Code(s):     --- Professional ---                        Z12.11, Encounter for screening for malignant neoplasm  of colon CPT copyright 2019 American Medical Association. All rights reserved. The codes documented in this report are preliminary and upon coder review may  be revised to meet current compliance requirements.  Melodie BouillonVarnita Amilah Greenspan, MD Michel BickersVarnita B. Maximino Greenlandahiliani MD, MD 12/25/2020 9:14:56 AM This report has been signed electronically. Number of Addenda: 0 Note Initiated On: 12/25/2020 8:35 AM Scope Withdrawal Time: 0 hours 12 minutes 50 seconds  Total Procedure Duration: 0 hours 16 minutes 38 seconds       [facility]

## 2020-12-25 NOTE — Anesthesia Postprocedure Evaluation (Signed)
Anesthesia Post Note  Patient: Fernando Harris  Procedure(s) Performed: COLONOSCOPY WITH PROPOFOL (N/A )  Patient location during evaluation: Endoscopy Anesthesia Type: General Level of consciousness: awake and alert Pain management: pain level controlled Vital Signs Assessment: post-procedure vital signs reviewed and stable Respiratory status: spontaneous breathing and respiratory function stable Cardiovascular status: stable Anesthetic complications: no   No complications documented.   Last Vitals:  Vitals:   12/25/20 0722 12/25/20 0905  BP: (!) 128/104 105/79  Pulse: 88 78  Resp: 16 16  Temp: (!) 36.3 C (!) 36.3 C  SpO2: 100% 96%    Last Pain:  Vitals:   12/25/20 0722  TempSrc: Temporal  PainSc: 0-No pain                 Pat Elicker K

## 2020-12-25 NOTE — H&P (Addendum)
Melodie Bouillon, MD [address], [address], [place], [place], [address] [address], [address], [place], [place], [address] Phone: [phone]  Fax: [phone]  Primary Care Physician:  Dale Rock House, MD   Pre-Procedure History & Physical: HPI:  Fernando Harris is a 50 y.o. male is here for a colonoscopy.   Past Medical History:  Diagnosis Date  . Bulging disc    evaluated [facility]  . GERD (gastroesophageal reflux disease)   . Hypertension   . Nephrolithiasis    kidney stones    Past Surgical History:  Procedure Laterality Date  . ELBOW SURGERY    . INGUINAL HERNIA REPAIR    . TONSILLECTOMY      Prior to Admission medications   Medication Sig Start Date End Date Taking? Authorizing Provider  amLODipine (NORVASC) 5 MG tablet TAKE 1 TABLET BY MOUTH EVERY DAY 09/22/20  Yes Dale Mascoutah, MD  aspirin EC 81 MG tablet Take 1 tablet (81 mg total) by mouth daily. 03/03/20  Yes Agbor-Etang, Arlys John, MD  atorvastatin (LIPITOR) 10 MG tablet TAKE 1 TABLET BY MOUTH EVERY DAY 09/09/20  Yes Dale Victor, MD  [employer] VITAMIN B12 1000 MCG tablet TAKE 1 TABLET BY MOUTH EVERY DAY 04/13/18  Yes Dale Hanover, MD  lansoprazole (PREVACID) 15 MG capsule Take 15 mg by mouth daily at 12 noon.   Yes [provider]  losartan (COZAAR) 50 MG tablet Take 1 tablet (50 mg total) by mouth daily. 11/24/20 12/24/20 Yes Agbor-Etang, Arlys John, MD  Na Sulfate-K Sulfate-Mg Sulf 17.5-3.13-1.6 GM/177ML SOLN At 5 PM the day before procedure take 1 bottle and 5 hours before procedure take 1 bottle. 11/25/20   Pasty Spillers, MD  omeprazole (PRILOSEC) 20 MG capsule Take 20 mg by mouth daily. Patient not taking: Reported on 12/25/2020    [provider]    Allergies as of 11/26/2020 - Review Complete 11/25/2020  Allergen Reaction Noted  . Aleve [naproxen] Swelling 04/18/2013    Family History  Problem Relation Age of Onset  . Hypertension Father   . Diabetes Paternal Grandmother    . Heart Problems Paternal Grandmother     Social History   Socioeconomic History  . Marital status: Married    Spouse name: Not on file  . Number of children: Not on file  . Years of education: Not on file  . Highest education level: Not on file  Occupational History  . Not on file  Tobacco Use  . Smoking status: Former Smoker    Packs/day: 1.00    Years: 15.00    Pack years: 15.00    Types: Cigarettes    Quit date: 04/18/2017    Years since quitting: 3.6  . Smokeless tobacco: Former Neurosurgeon    Types: Snuff    Quit date: 08/21/2013  Substance and Sexual Activity  . Alcohol use: Yes    Alcohol/week: 0.0 standard drinks    Comment: OCCASSIONAL  . Drug use: No  . Sexual activity: Not on file  Other Topics Concern  . Not on file  Social History Narrative   Married    Daughter    Social Determinants of Health   Financial Resource Strain: Not on file  Food Insecurity: Not on file  Transportation Needs: Not on file  Physical Activity: Not on file  Stress: Not on file  Social Connections: Not on file  Intimate Partner Violence: Not on file    Review of Systems: See HPI, otherwise negative ROS  Physical Exam: BP Marland Kitchen)  128/104   Pulse 88   Temp (!) 97.3 F (36.3 C) (Temporal)   Resp 16   Ht 5\' 10"  (1.778 m)   Wt 93.9 kg   SpO2 100%   BMI 29.70 kg/m  General:   Alert,  pleasant and cooperative in NAD Head:  Normocephalic and atraumatic. Neck:  Supple; no masses or thyromegaly. Lungs:  Clear throughout to auscultation, normal respiratory effort.    Heart:  +S1, +S2, Regular rate and rhythm, No edema. Abdomen:  Soft, nontender and nondistended. Normal bowel sounds, without guarding, and without rebound.   Neurologic:  Alert and  oriented x4;  grossly normal neurologically.  Impression/Plan: is here for a colonoscopy to be performed for average risk screening. Pt cancelled his upper endoscopy due to costs  Risks, benefits, limitations, and  alternatives regarding  colonoscopy have been reviewed with the patient.  Questions have been answered.  All parties agreeable.   [name], MD  12/25/2020, 8:36 AM

## 2021-01-22 ENCOUNTER — Other Ambulatory Visit (INDEPENDENT_AMBULATORY_CARE_PROVIDER_SITE_OTHER): Payer: BC Managed Care – PPO

## 2021-01-22 ENCOUNTER — Other Ambulatory Visit: Payer: Self-pay

## 2021-01-22 DIAGNOSIS — E7800 Pure hypercholesterolemia, unspecified: Secondary | ICD-10-CM | POA: Diagnosis not present

## 2021-01-22 DIAGNOSIS — R739 Hyperglycemia, unspecified: Secondary | ICD-10-CM | POA: Diagnosis not present

## 2021-01-22 DIAGNOSIS — I10 Essential (primary) hypertension: Secondary | ICD-10-CM

## 2021-01-22 LAB — HEPATIC FUNCTION PANEL
ALT: 20 U/L (ref 0–53)
AST: 14 U/L (ref 0–37)
Albumin: 4.6 g/dL (ref 3.5–5.2)
Alkaline Phosphatase: 66 U/L (ref 39–117)
Bilirubin, Direct: 0.1 mg/dL (ref 0.0–0.3)
Total Bilirubin: 0.7 mg/dL (ref 0.2–1.2)
Total Protein: 6.8 g/dL (ref 6.0–8.3)

## 2021-01-22 LAB — BASIC METABOLIC PANEL
BUN: 11 mg/dL (ref 6–23)
CO2: 28 mEq/L (ref 19–32)
Calcium: 9.5 mg/dL (ref 8.4–10.5)
Chloride: 105 mEq/L (ref 96–112)
Creatinine, Ser: 1.06 mg/dL (ref 0.40–1.50)
GFR: 83.01 mL/min (ref >60.00)
Glucose, Bld: 108 mg/dL — ABNORMAL HIGH (ref 70–99)
Potassium: 4.1 mEq/L (ref 3.5–5.1)
Sodium: 140 mEq/L (ref 135–145)

## 2021-01-22 LAB — LIPID PANEL
Cholesterol: 123 mg/dL (ref 0–200)
HDL: 28.2 mg/dL — ABNORMAL LOW (ref >39.00)
LDL Cholesterol: 64 mg/dL (ref 0–99)
NonHDL: 94.46
Total CHOL/HDL Ratio: 4
Triglycerides: 150 mg/dL — ABNORMAL HIGH (ref 0.0–149.0)
VLDL: 30 mg/dL (ref 0.0–40.0)

## 2021-01-22 LAB — HEMOGLOBIN A1C: Hgb A1c MFr Bld: 5.6 % (ref 4.6–6.5)

## 2021-01-26 ENCOUNTER — Encounter: Payer: Self-pay | Admitting: Internal Medicine

## 2021-01-26 ENCOUNTER — Telehealth (INDEPENDENT_AMBULATORY_CARE_PROVIDER_SITE_OTHER): Payer: BC Managed Care – PPO | Admitting: Internal Medicine

## 2021-01-26 DIAGNOSIS — E7800 Pure hypercholesterolemia, unspecified: Secondary | ICD-10-CM

## 2021-01-26 DIAGNOSIS — K219 Gastro-esophageal reflux disease without esophagitis: Secondary | ICD-10-CM | POA: Diagnosis not present

## 2021-01-26 DIAGNOSIS — I10 Essential (primary) hypertension: Secondary | ICD-10-CM

## 2021-01-26 DIAGNOSIS — Z720 Tobacco use: Secondary | ICD-10-CM

## 2021-01-26 DIAGNOSIS — R739 Hyperglycemia, unspecified: Secondary | ICD-10-CM

## 2021-01-26 DIAGNOSIS — H538 Other visual disturbances: Secondary | ICD-10-CM

## 2021-01-26 DIAGNOSIS — M7989 Other specified soft tissue disorders: Secondary | ICD-10-CM

## 2021-01-26 NOTE — Progress Notes (Addendum)
Patient ID: Fernando Harris, male   DOB: April 03, 1971, 50 y.o.   MRN: [id number]   Virtual Visit via video Note  This visit type was conducted due to national recommendations for restrictions regarding the COVID-19 pandemic (e.g. social distancing).  This format is felt to be most appropriate for this patient at this time.  All issues noted in this document were discussed and addressed.  No physical exam was performed (except for noted visual exam findings with Video Visits).   I connected with Fernando Harris by a video enabled telemedicine application and verified that I am speaking with the correct person using two identifiers. Location patient: home Location provider: work  Persons participating in the virtual visit: patient, provider  The limitations, risks, security and privacy concerns of performing an evaluation and management service by video or telephone and the availability of in person appointments have been discussed.  It has also been discussed with the patient that there may be a patient responsible charge related to this service. The patient expressed understanding and agreed to proceed. Visit converted to telephone due to video/audio difficulty.    Reason for visit: scheduled follow up.   HPI: Follow up appt to discuss his cholesterol and blood pressure.  He is doing relatively well.  Saw GI.  S/p colonoscopy - ok.  Recommended f/u colonoscopy in 10 years.  Changed to prevacid. Taking prevacid 33m q day now.  Controlling acid reflux.  No abdominal pain.  No bowel problem reported.  Has noticed a change in his vision.  Noticed that he has to hold his phone out to see it.  More blurred - close up.  No headache reported.  Is not smoking.  Wearing a nicotine patch.  Discussed the need to taper the patch. Also reports noticing a small soft tissue lump - right side - over rib cage.  No pain.  No redness.  Present over the last month.  Not changing.     ROS: See pertinent positives and  negatives per HPI.  Past Medical History:  Diagnosis Date  . Bulging disc    evaluated [facility]  . GERD (gastroesophageal reflux disease)   . Hypertension   . Nephrolithiasis    kidney stones    Past Surgical History:  Procedure Laterality Date  . COLONOSCOPY WITH PROPOFOL N/A 12/25/2020   Procedure: COLONOSCOPY WITH PROPOFOL;  Surgeon: TVirgel Manifold MD;  Location: [facility] ENDOSCOPY;  Service: Endoscopy;  Laterality: N/A;  . ELBOW SURGERY    . INGUINAL HERNIA REPAIR    . TONSILLECTOMY      Family History  Problem Relation Age of Onset  . Hypertension Father   . Diabetes Paternal Grandmother   . Heart Problems Paternal Grandmother     SOCIAL HX: reviewed.    Current Outpatient Medications:  .  amLODipine (NORVASC) 5 MG tablet, TAKE 1 TABLET BY MOUTH EVERY DAY, Disp: 90 tablet, Rfl: 1 .  aspirin EC 81 MG tablet, Take 1 tablet (81 mg total) by mouth daily., Disp: 90 tablet, Rfl: 3 .  atorvastatin (LIPITOR) 10 MG tablet, TAKE 1 TABLET BY MOUTH EVERY DAY, Disp: 90 tablet, Rfl: 1 .  [employer] VITAMIN B12 1000 MCG tablet, TAKE 1 TABLET BY MOUTH EVERY DAY, Disp: 90 tablet, Rfl: 1 .  lansoprazole (PREVACID) 15 MG capsule, Take 15 mg by mouth daily at 12 noon., Disp: , Rfl:  .  losartan (COZAAR) 50 MG tablet, Take 1 tablet (50 mg total) by mouth daily., Disp: 90 tablet,  Rfl: 1  EXAM:  VITALS per patient if applicable: 094/70  GENERAL: alert, oriented, appears well and in no acute distress  HEENT: atraumatic, conjunttiva clear, no obvious abnormalities on inspection of external nose and ears  NECK: normal movements of the head and neck  LUNGS: on inspection no signs of respiratory distress, breathing rate appears normal, no obvious gross SOB, gasping or wheezing  CV: no obvious cyanosis  PSYCH/NEURO: pleasant and cooperative, no obvious depression or anxiety, speech and thought processing grossly intact  ASSESSMENT AND PLAN:  Discussed the following assessment and  plan:  Problem List Items Addressed This Visit    Blurred vision    On questioning, he describes not being able to see as well close up.  Has to hold the phone further away from his face.  Request referral to ophthalmology.       Relevant Orders   Ambulatory referral to Ophthalmology   Essential hypertension, benign    Blood pressure as outlined.  Continue losartan and amlodipine.  Follow pressures.  Follow metabolic panel.       Relevant Orders   CBC with Differential/Platelet   Basic metabolic panel   TSH   GERD (gastroesophageal reflux disease)    On 8m prevacid now.  Symptoms controlled.  No changes.  Follow.       Hypercholesterolemia    Continue lipitor.  Low cholesterol diet and exercise.  Follow lipid panel and liver function tests.        Relevant Orders   Hepatic function panel   Lipid panel   Hyperglycemia    Low carb diet and exercise.  Follow met b and a1c.       Relevant Orders   Hemoglobin A1c   Soft tissue mass    Discussed inability to evaluate over the phone.  Non tender.  No redness.  No enlarging.  Offered appt.  He will notify me if change.  Will monitor.  Reevaluate at next appt.       Tobacco abuse    Wearing a nicotine patch.  Discussed tapering the patch. Not smoking.  Follow.           I discussed the assessment and treatment plan with the patient. The patient was provided an opportunity to ask questions and all were answered. The patient agreed with the plan and demonstrated an understanding of the instructions.   The patient was advised to call back or seek an in-person evaluation if the symptoms worsen or if the condition fails to improve as anticipated.  I provided 25 minutes of non-face-to-face time during this encounter.   CEinar Pheasant MD

## 2021-02-01 ENCOUNTER — Encounter: Payer: Self-pay | Admitting: Internal Medicine

## 2021-02-01 DIAGNOSIS — M7989 Other specified soft tissue disorders: Secondary | ICD-10-CM | POA: Insufficient documentation

## 2021-02-01 NOTE — Assessment & Plan Note (Addendum)
Wearing a nicotine patch.  Discussed tapering the patch. Not smoking.  Follow.

## 2021-02-01 NOTE — Addendum Note (Signed)
Addended by: Charm Barges on: 02/01/2021 09:47 AM   Modules accepted: Orders

## 2021-02-01 NOTE — Assessment & Plan Note (Signed)
Blood pressure as outlined.  Continue losartan and amlodipine.  Follow pressures.  Follow metabolic panel.   

## 2021-02-01 NOTE — Assessment & Plan Note (Signed)
Low carb diet and exercise.  Follow met b and a1c.  

## 2021-02-01 NOTE — Assessment & Plan Note (Signed)
Continue lipitor.  Low cholesterol diet and exercise.  Follow lipid panel and liver function tests.   

## 2021-02-01 NOTE — Assessment & Plan Note (Signed)
On 15mg  prevacid now.  Symptoms controlled.  No changes.  Follow.

## 2021-02-01 NOTE — Assessment & Plan Note (Signed)
On questioning, he describes not being able to see as well close up.  Has to hold the phone further away from his face.  Request referral to ophthalmology.

## 2021-02-01 NOTE — Assessment & Plan Note (Signed)
Discussed inability to evaluate over the phone.  Non tender.  No redness.  No enlarging.  Offered appt.  He will notify me if change.  Will monitor.  Reevaluate at next appt.

## 2021-02-19 ENCOUNTER — Other Ambulatory Visit: Payer: Self-pay | Admitting: Internal Medicine

## 2021-02-24 ENCOUNTER — Ambulatory Visit: Payer: BC Managed Care – PPO | Admitting: Gastroenterology

## 2021-03-05 ENCOUNTER — Other Ambulatory Visit: Payer: Self-pay | Admitting: Internal Medicine

## 2021-04-07 DIAGNOSIS — H524 Presbyopia: Secondary | ICD-10-CM | POA: Diagnosis not present

## 2021-05-08 ENCOUNTER — Other Ambulatory Visit (INDEPENDENT_AMBULATORY_CARE_PROVIDER_SITE_OTHER): Payer: BC Managed Care – PPO

## 2021-05-08 ENCOUNTER — Other Ambulatory Visit: Payer: Self-pay

## 2021-05-08 DIAGNOSIS — R739 Hyperglycemia, unspecified: Secondary | ICD-10-CM | POA: Diagnosis not present

## 2021-05-08 DIAGNOSIS — E7800 Pure hypercholesterolemia, unspecified: Secondary | ICD-10-CM

## 2021-05-08 DIAGNOSIS — I10 Essential (primary) hypertension: Secondary | ICD-10-CM | POA: Diagnosis not present

## 2021-05-08 LAB — LIPID PANEL
Cholesterol: 123 mg/dL (ref 0–200)
HDL: 33.8 mg/dL — ABNORMAL LOW (ref >39.00)
LDL Cholesterol: 59 mg/dL (ref 0–99)
NonHDL: 89.07
Total CHOL/HDL Ratio: 4
Triglycerides: 151 mg/dL — ABNORMAL HIGH (ref 0.0–149.0)
VLDL: 30.2 mg/dL (ref 0.0–40.0)

## 2021-05-08 LAB — CBC WITH DIFFERENTIAL/PLATELET
Basophils Absolute: 0 10*3/uL (ref 0.0–0.1)
Basophils Relative: 0.7 % (ref 0.0–3.0)
Eosinophils Absolute: 0.1 10*3/uL (ref 0.0–0.7)
Eosinophils Relative: 1.4 % (ref 0.0–5.0)
HCT: 44.5 % (ref 39.0–52.0)
Hemoglobin: 15.3 g/dL (ref 13.0–17.0)
Lymphocytes Relative: 28.4 % (ref 12.0–46.0)
Lymphs Abs: 1.6 10*3/uL (ref 0.7–4.0)
MCHC: 34.3 g/dL (ref 30.0–36.0)
MCV: 88.1 fl (ref 78.0–100.0)
Monocytes Absolute: 0.5 10*3/uL (ref 0.1–1.0)
Monocytes Relative: 9.7 % (ref 3.0–12.0)
Neutro Abs: 3.3 10*3/uL (ref 1.4–7.7)
Neutrophils Relative %: 59.8 % (ref 43.0–77.0)
Platelets: 217 10*3/uL (ref 150.0–400.0)
RBC: 5.06 Mil/uL (ref 4.22–5.81)
RDW: 13.4 % (ref 11.5–15.5)
WBC: 5.6 10*3/uL (ref 4.0–10.5)

## 2021-05-08 LAB — BASIC METABOLIC PANEL
BUN: 14 mg/dL (ref 6–23)
CO2: 29 mEq/L (ref 19–32)
Calcium: 9.5 mg/dL (ref 8.4–10.5)
Chloride: 103 mEq/L (ref 96–112)
Creatinine, Ser: 1.15 mg/dL (ref 0.40–1.50)
GFR: 75.13 mL/min (ref >60.00)
Glucose, Bld: 102 mg/dL — ABNORMAL HIGH (ref 70–99)
Potassium: 4.1 mEq/L (ref 3.5–5.1)
Sodium: 140 mEq/L (ref 135–145)

## 2021-05-08 LAB — HEPATIC FUNCTION PANEL
ALT: 29 U/L (ref 0–53)
AST: 17 U/L (ref 0–37)
Albumin: 4.6 g/dL (ref 3.5–5.2)
Alkaline Phosphatase: 81 U/L (ref 39–117)
Bilirubin, Direct: 0.2 mg/dL (ref 0.0–0.3)
Total Bilirubin: 0.8 mg/dL (ref 0.2–1.2)
Total Protein: 6.8 g/dL (ref 6.0–8.3)

## 2021-05-08 LAB — HEMOGLOBIN A1C: Hgb A1c MFr Bld: 5.7 % (ref 4.6–6.5)

## 2021-05-08 LAB — TSH: TSH: 0.87 u[IU]/mL (ref 0.35–5.50)

## 2021-05-13 ENCOUNTER — Ambulatory Visit: Payer: BC Managed Care – PPO | Admitting: Internal Medicine

## 2021-05-13 ENCOUNTER — Other Ambulatory Visit: Payer: Self-pay

## 2021-05-13 VITALS — BP 126/72 | HR 74 | Temp 97.8°F | Resp 16 | Ht 70.0 in | Wt 214.4 lb

## 2021-05-13 DIAGNOSIS — K219 Gastro-esophageal reflux disease without esophagitis: Secondary | ICD-10-CM

## 2021-05-13 DIAGNOSIS — Z1211 Encounter for screening for malignant neoplasm of colon: Secondary | ICD-10-CM

## 2021-05-13 DIAGNOSIS — Z720 Tobacco use: Secondary | ICD-10-CM

## 2021-05-13 DIAGNOSIS — E7800 Pure hypercholesterolemia, unspecified: Secondary | ICD-10-CM

## 2021-05-13 DIAGNOSIS — I10 Essential (primary) hypertension: Secondary | ICD-10-CM

## 2021-05-13 DIAGNOSIS — R739 Hyperglycemia, unspecified: Secondary | ICD-10-CM | POA: Diagnosis not present

## 2021-05-13 DIAGNOSIS — F419 Anxiety disorder, unspecified: Secondary | ICD-10-CM

## 2021-05-13 DIAGNOSIS — Z125 Encounter for screening for malignant neoplasm of prostate: Secondary | ICD-10-CM

## 2021-05-13 MED ORDER — ATORVASTATIN CALCIUM 10 MG PO TABS: 10.0000 mg | ORAL_TABLET | Freq: Every day | ORAL | 1 refills | Status: DC

## 2021-05-13 MED ORDER — AMLODIPINE BESYLATE 5 MG PO TABS: 5.0000 mg | ORAL_TABLET | Freq: Every day | ORAL | 1 refills | Status: DC

## 2021-05-13 MED ORDER — LOSARTAN POTASSIUM 50 MG PO TABS: 50.0000 mg | ORAL_TABLET | Freq: Every day | ORAL | 1 refills | Status: DC

## 2021-05-13 NOTE — Assessment & Plan Note (Signed)
Colonoscopy 12/25/2020 revealed hemorrhoids and diverticulosis.  Recommended follow-up in 10 years. 

## 2021-05-13 NOTE — Progress Notes (Signed)
Patient ID: Fernando Harris, male   DOB: 04/12/1971, 50 y.o.   MRN: [id number]   Subjective:    Patient ID: Fernando Harris, male    DOB: Mar 14, 1971, 50 y.o.   MRN: [id number]  HPI This visit occurred during the SARS-CoV-2 public health emergency.  Safety protocols were in place, including screening questions prior to the visit, additional usage of staff PPE, and extensive cleaning of exam room while observing appropriate contact time as indicated for disinfecting solutions.   Patient here for a scheduled follow up.  Follow-up regarding his blood pressure and cholesterol.  Overall feels he is doing well.  Has been rollerskating.  Trying to be more active.  No chest pain or tightness reported with increased activity or exertion.  No shortness of breath.  Controls his allergy symptoms with occasional Flonase.  Acid reflux controlled with low-dose Prevacid.  No abdominal pain or cramping.  No bowel change.  He is not smoking.  He does continue to wear a nicotine patch.  We discussed the need to taper down.   Past Medical History:  Diagnosis Date   Bulging disc    evaluated [facility]   GERD (gastroesophageal reflux disease)    Hypertension    Nephrolithiasis    kidney stones   Past Surgical History:  Procedure Laterality Date   COLONOSCOPY WITH PROPOFOL N/A 12/25/2020   Procedure: COLONOSCOPY WITH PROPOFOL;  Surgeon: Virgel Manifold, MD;  Location: [facility] ENDOSCOPY;  Service: Endoscopy;  Laterality: N/A;   ELBOW SURGERY     INGUINAL HERNIA REPAIR     TONSILLECTOMY     Family History  Problem Relation Age of Onset   Hypertension Father    Diabetes Paternal Grandmother    Heart Problems Paternal Grandmother    Social History   Socioeconomic History   Marital status: Married    Spouse name: Not on file   Number of children: Not on file   Years of education: Not on file   Highest education level: Not on file  Occupational History   Not on file  Tobacco Use   Smoking  status: Former    Packs/day: 1.00    Years: 15.00    Pack years: 15.00    Types: Cigarettes    Quit date: 04/18/2017    Years since quitting: 4.0   Smokeless tobacco: Former    Types: Snuff    Quit date: 08/21/2013  Substance and Sexual Activity   Alcohol use: Yes    Alcohol/week: 0.0 standard drinks    Comment: OCCASSIONAL   Drug use: No   Sexual activity: Not on file  Other Topics Concern   Not on file  Social History Narrative   Married    Daughter    Social Determinants of Health   Financial Resource Strain: Not on file  Food Insecurity: Not on file  Transportation Needs: Not on file  Physical Activity: Not on file  Stress: Not on file  Social Connections: Not on file    Review of Systems  Constitutional:  Negative for appetite change and unexpected weight change.  HENT:  Negative for congestion and sinus pressure.        Allergy symptoms controlled with steroid nasal spray.   Respiratory:  Negative for cough, chest tightness and shortness of breath.   Cardiovascular:  Negative for chest pain, palpitations and leg swelling.  Gastrointestinal:  Negative for abdominal pain, diarrhea, nausea and vomiting.  Genitourinary:  Negative for difficulty urinating and dysuria.  Musculoskeletal:  Negative for joint swelling and myalgias.  Skin:  Negative for color change and rash.  Neurological:  Negative for dizziness, light-headedness and headaches.  Psychiatric/Behavioral:  Negative for agitation and dysphoric mood.       Objective:    Physical Exam Vitals reviewed.  Constitutional:      General: He is not in acute distress.    Appearance: Normal appearance. He is well-developed.  HENT:     Head: Normocephalic and atraumatic.     Right Ear: External ear normal.     Left Ear: External ear normal.  Eyes:     General: No scleral icterus.       Right eye: No discharge.        Left eye: No discharge.     Conjunctiva/sclera: Conjunctivae normal.  Cardiovascular:      Rate and Rhythm: Normal rate and regular rhythm.  Pulmonary:     Effort: Pulmonary effort is normal. No respiratory distress.     Breath sounds: Normal breath sounds.  Abdominal:     General: Bowel sounds are normal.     Palpations: Abdomen is soft.     Tenderness: There is no abdominal tenderness.  Musculoskeletal:        General: No swelling or tenderness.     Cervical back: Neck supple. No tenderness.  Lymphadenopathy:     Cervical: No cervical adenopathy.  Skin:    Findings: No erythema or rash.  Neurological:     Mental Status: He is alert.  Psychiatric:        Mood and Affect: Mood normal.        Behavior: Behavior normal.    BP 126/72   Pulse 74   Temp 97.8 F (36.6 C)   Resp 16   Ht _0  (1.778 m)   Wt 214 lb 6.4 oz (97.3 kg)   SpO2 98%   BMI 30.76 kg/m  Wt Readings from Last 3 Encounters:  05/13/21 214 lb 6.4 oz (97.3 kg)  01/26/21 212 lb (96.2 kg)  12/25/20 207 lb (93.9 kg)    Outpatient Encounter Medications as of 05/13/2021  Medication Sig   aspirin EC 81 MG tablet Take 1 tablet (81 mg total) by mouth daily.   [facility] VITAMIN B12 1000 MCG tablet TAKE 1 TABLET BY MOUTH EVERY DAY   lansoprazole (PREVACID) 15 MG capsule Take 15 mg by mouth daily at 12 noon.   [DISCONTINUED] amLODipine (NORVASC) 5 MG tablet TAKE 1 TABLET BY MOUTH EVERY DAY   [DISCONTINUED] atorvastatin (LIPITOR) 10 MG tablet TAKE 1 TABLET BY MOUTH EVERY DAY   amLODipine (NORVASC) 5 MG tablet Take 1 tablet (5 mg total) by mouth daily.   atorvastatin (LIPITOR) 10 MG tablet Take 1 tablet (10 mg total) by mouth daily.   losartan (COZAAR) 50 MG tablet Take 1 tablet (50 mg total) by mouth daily.   [DISCONTINUED] losartan (COZAAR) 50 MG tablet Take 1 tablet (50 mg total) by mouth daily.   No facility-administered encounter medications on file as of 05/13/2021.     Lab Results  Component Value Date   WBC 5.6 05/08/2021   HGB 15.3 05/08/2021   HCT 44.5 05/08/2021   PLT 217.0 05/08/2021   GLUCOSE  102 (H) 05/08/2021   CHOL 123 05/08/2021   TRIG 151.0 (H) 05/08/2021   HDL 33.80 (L) 05/08/2021   LDLDIRECT 95.0 02/28/2020   LDLCALC 59 05/08/2021   ALT 29 05/08/2021   AST 17 05/08/2021   NA 140 05/08/2021  K 4.1 05/08/2021   CL 103 05/08/2021   CREATININE 1.15 05/08/2021   BUN 14 05/08/2021   CO2 29 05/08/2021   TSH 0.87 05/08/2021   PSA 0.25 09/25/2020   HGBA1C 5.7 05/08/2021       Assessment & Plan:   Problem List Items Addressed This Visit     Anxiety    Doing better.  On no medication now.  Follow.         Colon cancer screening    Colonoscopy 12/25/2020 revealed hemorrhoids and diverticulosis.  Recommended follow-up in 10 years.       Essential hypertension, benign    Blood pressure as outlined.  Continue losartan and amlodipine.  Follow pressures.  Follow metabolic panel.        Relevant Medications   atorvastatin (LIPITOR) 10 MG tablet   losartan (COZAAR) 50 MG tablet   amLODipine (NORVASC) 5 MG tablet   Other Relevant Orders   Basic metabolic panel   GERD (gastroesophageal reflux disease)    On 4m prevacid now.  Symptoms controlled.  No changes.  Follow.        Hypercholesterolemia - Primary    Continue lipitor.  Low cholesterol diet and exercise.  Follow lipid panel and liver function tests.         Relevant Medications   atorvastatin (LIPITOR) 10 MG tablet   losartan (COZAAR) 50 MG tablet   amLODipine (NORVASC) 5 MG tablet   Other Relevant Orders   Lipid panel   Hepatic function panel   Hyperglycemia    Low carb diet and exercise.  Follow met b and a1c.        Relevant Orders   Hemoglobin A1c   Tobacco abuse    Wearing a nicotine patch.  Discussed tapering dose.  Follow.        Other Visit Diagnoses     Prostate cancer screening       Relevant Orders   PSA, Medicare ( [place] [facility] only)   PSA        CEinar Pheasant MD

## 2021-05-23 ENCOUNTER — Encounter: Payer: Self-pay | Admitting: Internal Medicine

## 2021-05-23 NOTE — Assessment & Plan Note (Signed)
Low carb diet and exercise.  Follow met b and a1c.  

## 2021-05-23 NOTE — Assessment & Plan Note (Signed)
On 15mg  prevacid now.  Symptoms controlled.  No changes.  Follow.

## 2021-05-23 NOTE — Assessment & Plan Note (Signed)
Continue lipitor.  Low cholesterol diet and exercise.  Follow lipid panel and liver function tests.   

## 2021-05-23 NOTE — Assessment & Plan Note (Signed)
Doing better.  On no medication now.  Follow.

## 2021-05-23 NOTE — Assessment & Plan Note (Signed)
Wearing a nicotine patch.  Discussed tapering dose.  Follow.

## 2021-05-23 NOTE — Assessment & Plan Note (Signed)
Blood pressure as outlined.  Continue losartan and amlodipine.  Follow pressures.  Follow metabolic panel.   

## 2021-07-22 ENCOUNTER — Telehealth: Payer: BC Managed Care – PPO | Admitting: Family

## 2021-07-22 ENCOUNTER — Telehealth: Payer: Self-pay | Admitting: Internal Medicine

## 2021-07-22 DIAGNOSIS — H60502 Unspecified acute noninfective otitis externa, left ear: Secondary | ICD-10-CM | POA: Diagnosis not present

## 2021-07-22 DIAGNOSIS — Z5329 Procedure and treatment not carried out because of patient's decision for other reasons: Secondary | ICD-10-CM

## 2021-07-22 MED ORDER — CIPROFLOXACIN-DEXAMETHASONE 0.3-0.1 % OT SUSP: 4.0000 [drp] | Freq: Two times a day (BID) | OTIC | 0 refills | Status: DC

## 2021-07-22 NOTE — Progress Notes (Signed)
Virtual Visit Consent   Fernando Harris, you are scheduled for a virtual visit with a Fernando Harris today.     Just as with appointments in the office, your consent must be obtained to participate.  Your consent will be active for this visit and any virtual visit you may have with one of our providers in the next 365 days.     If you have a MyChart account, a copy of this consent can be sent to you electronically.  All virtual visits are billed to your insurance company just like a traditional visit in the office.    As this is a virtual visit, video technology does not allow for your Harris to perform a traditional examination.  This may limit your Harris's ability to fully assess your condition.  If your Harris identifies any concerns that need to be evaluated in person or the need to arrange testing (such as labs, EKG, etc.), we will make arrangements to do so.     Although advances in technology are sophisticated, we cannot ensure that it will always work on either your end or our end.  If the connection with a video visit is poor, the visit may have to be switched to a telephone visit.  With either a video or telephone visit, we are not always able to ensure that we have a secure connection.     I need to obtain your verbal consent now.   Are you willing to proceed with your visit today?    Fernando Harris has provided verbal consent on 07/22/2021 for a virtual visit (video or telephone).   Fernando Rodney, FNP   Date: 07/22/2021 5:26 PM   Virtual Visit via Video Note   I, Fernando Harris, connected with  Fernando Harris  ([id number], 18-Aug-1971) on 07/22/21 at  5:15 PM EDT by a video-enabled telemedicine application and verified that I am speaking with the correct person using two identifiers.  Location: Patient: Virtual Visit Location Patient: work Harris: Virtual Visit Location Harris: Home   I discussed the limitations of evaluation and management by telemedicine  and the availability of in person appointments. The patient expressed understanding and agreed to proceed.    History of Present Illness: Fernando Harris is a 50 y.o. who identifies as a male who was assigned male at birth, and is being seen today for ear pain.  HPI: Otalgia  There is pain in the left ear. This is a new problem. The current episode started in the past 7 days. The problem occurs constantly. The problem has been gradually worsening. There has been no fever. The pain is at a severity of 5/10. The pain is moderate. Associated symptoms include ear discharge and rhinorrhea. Pertinent negatives include no coughing, headaches or sore throat. Hearing loss: slight.He has tried ear drops for the symptoms. The treatment provided mild relief.   Problems:  Patient Active Problem List   Diagnosis Date Noted   Soft tissue mass 02/01/2021   Colon cancer screening    GERD (gastroesophageal reflux disease) 10/06/2020   Carotid stenosis, bilateral 04/08/2020   Blurred vision 03/31/2020   Paraphasia 03/31/2020   Slurred speech 03/17/2020   Increased heart rate 02/28/2020   Hyperglycemia 02/28/2020   Urinary frequency 08/28/2019   Febrile illness 01/30/2019   Skin lesion of back 11/18/2018   Neuropathy 12/27/2016   External hemorrhoid 09/13/2016   Right arm pain 06/07/2016   Neck pain 10/06/2015   Nasal congestion 10/06/2015  Hypercholesterolemia 04/29/2015   Tobacco abuse 04/29/2015   Health care maintenance 01/02/2015   Abnormal CXR 08/05/2014   Chest pain 01/18/2014   Anxiety 09/23/2013   Abrasion of arm, left 08/26/2013   Rash 07/09/2013   Kidney stones 04/22/2013   Essential hypertension, benign 04/22/2013   Cough 04/22/2013   Back pain 04/22/2013    Allergies:  Allergies  Allergen Reactions   Aleve [Naproxen] Swelling    Lip swelling & mouth blisters   Medications:  Current Outpatient Medications:    ciprofloxacin-dexamethasone (CIPRODEX) OTIC suspension, Place 4  drops into the left ear 2 (two) times daily., Disp: 7.5 mL, Rfl: 0   amLODipine (NORVASC) 5 MG tablet, Take 1 tablet (5 mg total) by mouth daily., Disp: 90 tablet, Rfl: 1   aspirin EC 81 MG tablet, Take 1 tablet (81 mg total) by mouth daily., Disp: 90 tablet, Rfl: 3   atorvastatin (LIPITOR) 10 MG tablet, Take 1 tablet (10 mg total) by mouth daily., Disp: 90 tablet, Rfl: 1   [employer] VITAMIN B12 1000 MCG tablet, TAKE 1 TABLET BY MOUTH EVERY DAY, Disp: 90 tablet, Rfl: 1   lansoprazole (PREVACID) 15 MG capsule, Take 15 mg by mouth daily at 12 noon., Disp: , Rfl:    losartan (COZAAR) 50 MG tablet, Take 1 tablet (50 mg total) by mouth daily., Disp: 90 tablet, Rfl: 1  Observations/Objective: Patient is well-developed, well-nourished in no acute distress.  Resting comfortably   Head is normocephalic, atraumatic.  No labored breathing. Speech is clear and coherent with logical content.  Patient is alert and oriented at baseline.   Assessment and Plan: 1. Acute otitis externa of left ear, unspecified type - ciprofloxacin-dexamethasone (CIPRODEX) OTIC suspension; Place 4 drops into the left ear 2 (two) times daily.  Dispense: 7.5 mL; Refill: 0 Rest  Do not stick anything into ear Tylenol for pain RTO if symptoms worsen or do not improve   Follow Up Instructions: I discussed the assessment and treatment plan with the patient. The patient was provided an opportunity to ask questions and all were answered. The patient agreed with the plan and demonstrated an understanding of the instructions.  A copy of instructions were sent to the patient via MyChart unless otherwise noted below.     The patient was advised to call back or seek an in-person evaluation if the symptoms worsen or if the condition fails to improve as anticipated.  Time:  I spent 7 minutes with the patient via telehealth technology discussing the above problems/concerns.    Fernando Rodney, FNP

## 2021-07-22 NOTE — Progress Notes (Signed)
Attempted to connect with patient several times. Not successful. Will close visit out.   Jannifer Rodney, FNP

## 2021-07-22 NOTE — Telephone Encounter (Signed)
Patient informed, Due to the high volume of calls and your symptoms we have to forward your call to our Triage Nurse to expedient your call. Please hold for the transfer.  Patient transferred to [facility] at Access Nurse. Due to having ear pain and drainage the past few days.No openings in office or virtual.

## 2021-07-22 NOTE — Telephone Encounter (Signed)
Spoken to patient. He stated he is going to go through [facility] to be evaluated due to regular UC costs.

## 2021-07-23 NOTE — Telephone Encounter (Signed)
Pt evaluated and treated.

## 2021-07-24 DIAGNOSIS — H6692 Otitis media, unspecified, left ear: Secondary | ICD-10-CM | POA: Diagnosis not present

## 2021-11-16 ENCOUNTER — Other Ambulatory Visit: Payer: Self-pay

## 2021-11-16 ENCOUNTER — Other Ambulatory Visit (INDEPENDENT_AMBULATORY_CARE_PROVIDER_SITE_OTHER): Payer: BC Managed Care – PPO

## 2021-11-16 DIAGNOSIS — R739 Hyperglycemia, unspecified: Secondary | ICD-10-CM | POA: Diagnosis not present

## 2021-11-16 DIAGNOSIS — E7800 Pure hypercholesterolemia, unspecified: Secondary | ICD-10-CM

## 2021-11-16 DIAGNOSIS — Z125 Encounter for screening for malignant neoplasm of prostate: Secondary | ICD-10-CM

## 2021-11-16 DIAGNOSIS — I10 Essential (primary) hypertension: Secondary | ICD-10-CM

## 2021-11-16 LAB — HEPATIC FUNCTION PANEL
ALT: 26 U/L (ref 0–53)
AST: 16 U/L (ref 0–37)
Albumin: 4.5 g/dL (ref 3.5–5.2)
Alkaline Phosphatase: 70 U/L (ref 39–117)
Bilirubin, Direct: 0.2 mg/dL (ref 0.0–0.3)
Total Bilirubin: 0.8 mg/dL (ref 0.2–1.2)
Total Protein: 6.4 g/dL (ref 6.0–8.3)

## 2021-11-16 LAB — BASIC METABOLIC PANEL
BUN: 12 mg/dL (ref 6–23)
CO2: 28 mEq/L (ref 19–32)
Calcium: 9.4 mg/dL (ref 8.4–10.5)
Chloride: 104 mEq/L (ref 96–112)
Creatinine, Ser: 1.04 mg/dL (ref 0.40–1.50)
GFR: 84.45 mL/min (ref >60.00)
Glucose, Bld: 106 mg/dL — ABNORMAL HIGH (ref 70–99)
Potassium: 4.3 mEq/L (ref 3.5–5.1)
Sodium: 140 mEq/L (ref 135–145)

## 2021-11-16 LAB — LIPID PANEL
Cholesterol: 134 mg/dL (ref 0–200)
HDL: 30.2 mg/dL — ABNORMAL LOW (ref >39.00)
LDL Cholesterol: 72 mg/dL (ref 0–99)
NonHDL: 103.4
Total CHOL/HDL Ratio: 4
Triglycerides: 155 mg/dL — ABNORMAL HIGH (ref 0.0–149.0)
VLDL: 31 mg/dL (ref 0.0–40.0)

## 2021-11-16 LAB — PSA: PSA: 0.33 ng/mL (ref 0.10–4.00)

## 2021-11-16 LAB — HEMOGLOBIN A1C: Hgb A1c MFr Bld: 5.5 % (ref 4.6–6.5)

## 2021-11-18 ENCOUNTER — Ambulatory Visit (INDEPENDENT_AMBULATORY_CARE_PROVIDER_SITE_OTHER): Payer: BC Managed Care – PPO | Admitting: Internal Medicine

## 2021-11-18 ENCOUNTER — Other Ambulatory Visit: Payer: Self-pay

## 2021-11-18 VITALS — BP 120/76 | HR 81 | Temp 97.9°F | Resp 16 | Ht 70.0 in | Wt 219.0 lb

## 2021-11-18 DIAGNOSIS — I10 Essential (primary) hypertension: Secondary | ICD-10-CM | POA: Diagnosis not present

## 2021-11-18 DIAGNOSIS — K219 Gastro-esophageal reflux disease without esophagitis: Secondary | ICD-10-CM

## 2021-11-18 DIAGNOSIS — E7800 Pure hypercholesterolemia, unspecified: Secondary | ICD-10-CM

## 2021-11-18 DIAGNOSIS — R739 Hyperglycemia, unspecified: Secondary | ICD-10-CM

## 2021-11-18 DIAGNOSIS — Z0000 Encounter for general adult medical examination without abnormal findings: Secondary | ICD-10-CM

## 2021-11-18 DIAGNOSIS — H538 Other visual disturbances: Secondary | ICD-10-CM

## 2021-11-18 DIAGNOSIS — Z720 Tobacco use: Secondary | ICD-10-CM

## 2021-11-18 DIAGNOSIS — R4781 Slurred speech: Secondary | ICD-10-CM

## 2021-11-18 DIAGNOSIS — F419 Anxiety disorder, unspecified: Secondary | ICD-10-CM

## 2021-11-18 MED ORDER — ATORVASTATIN CALCIUM 20 MG PO TABS: 20.0000 mg | ORAL_TABLET | Freq: Every day | ORAL | 1 refills | Status: DC

## 2021-11-18 NOTE — Assessment & Plan Note (Signed)
Physical today 11/18/21.  Colonoscopy 12/25/20 - hemorrhoid - f/u in 10 years.  Check psa.

## 2021-11-18 NOTE — Progress Notes (Signed)
Patient ID: DAWSON ALBERS, male   DOB: 1971-07-07, 51 y.o.   MRN: [id number]   Subjective:    Patient ID: [name], male    DOB: 1970-12-11, 51 y.o.   MRN: [id number]  This visit occurred during the SARS-CoV-2 public health emergency.  Safety protocols were in place, including screening questions prior to the visit, additional usage of staff PPE, and extensive cleaning of exam room while observing appropriate contact time as indicated for disinfecting solutions.   Patient here for his physical exam.   Chief Complaint  Patient presents with   Annual Exam   .   HPI Reports having to use readers - vision change.  No acute vision loss.  No increased headache reported.  Does report intermittently - speech change.  May last 1-2 minutes.  Question if slurred - just different - notices a difference in forming his words.  Occurred 2-3x last month.  No chest pain or sob reported.  No increased heart rate or palpitations noted.  No abdominal pain.  Bowels moving. No current symptoms.     Past Medical History:  Diagnosis Date   Bulging disc    evaluated [facility]   GERD (gastroesophageal reflux disease)    Hypertension    Nephrolithiasis    kidney stones   Past Surgical History:  Procedure Laterality Date   COLONOSCOPY WITH PROPOFOL N/A 12/25/2020   Procedure: COLONOSCOPY WITH PROPOFOL;  Surgeon: Virgel Manifold, MD;  Location: [facility] ENDOSCOPY;  Service: Endoscopy;  Laterality: N/A;   ELBOW SURGERY     INGUINAL HERNIA REPAIR     TONSILLECTOMY     Family History  Problem Relation Age of Onset   Hypertension Father    Diabetes Paternal Grandmother    Heart Problems Paternal Grandmother    Social History   Socioeconomic History   Marital status: Married    Spouse name: Not on file   Number of children: Not on file   Years of education: Not on file   Highest education level: Not on file  Occupational History   Not on file  Tobacco Use   Smoking status: Former     Packs/day: 1.00    Years: 15.00    Pack years: 15.00    Types: Cigarettes    Quit date: 04/18/2017    Years since quitting: 4.6   Smokeless tobacco: Former    Types: Snuff    Quit date: 08/21/2013  Substance and Sexual Activity   Alcohol use: Yes    Alcohol/week: 0.0 standard drinks    Comment: OCCASSIONAL   Drug use: No   Sexual activity: Not on file  Other Topics Concern   Not on file  Social History Narrative   Married    Daughter    Social Determinants of Health   Financial Resource Strain: Not on file  Food Insecurity: Not on file  Transportation Needs: Not on file  Physical Activity: Not on file  Stress: Not on file  Social Connections: Not on file     Review of Systems  Constitutional:  Negative for appetite change and unexpected weight change.  HENT:  Negative for congestion, sinus pressure and sore throat.   Eyes:  Positive for visual disturbance. Negative for pain.  Respiratory:  Negative for cough, chest tightness and shortness of breath.   Cardiovascular:  Negative for chest pain and palpitations.  Gastrointestinal:  Negative for abdominal pain, diarrhea, nausea and vomiting.  Genitourinary:  Negative for difficulty urinating and frequency.  Musculoskeletal:  Negative for back pain and joint swelling.  Skin:  Negative for color change and rash.  Neurological:  Negative for dizziness and headaches.  Hematological:  Negative for adenopathy. Does not bruise/bleed easily.  Psychiatric/Behavioral:  Negative for decreased concentration and dysphoric mood.       Objective:     BP 120/76    Pulse 81    Temp 97.9 F (36.6 C)    Resp 16    Ht 5' 10"  (1.778 m)    Wt 219 lb (99.3 kg)    SpO2 98%    BMI 31.42 kg/m  Wt Readings from Last 3 Encounters:  11/18/21 219 lb (99.3 kg)  05/13/21 214 lb 6.4 oz (97.3 kg)  01/26/21 212 lb (96.2 kg)    Physical Exam Constitutional:      General: He is not in acute distress.    Appearance: Normal appearance. He is  well-developed.  HENT:     Head: Normocephalic and atraumatic.     Right Ear: External ear normal.     Left Ear: External ear normal.  Eyes:     General: No scleral icterus.       Right eye: No discharge.        Left eye: No discharge.  Cardiovascular:     Rate and Rhythm: Normal rate and regular rhythm.  Pulmonary:     Effort: Pulmonary effort is normal. No respiratory distress.     Breath sounds: Normal breath sounds.  Abdominal:     General: Bowel sounds are normal.     Palpations: Abdomen is soft.     Tenderness: There is no abdominal tenderness.  Musculoskeletal:        General: No swelling or tenderness.     Cervical back: Neck supple. No tenderness.  Lymphadenopathy:     Cervical: No cervical adenopathy.  Skin:    Findings: No erythema or rash.  Neurological:     Mental Status: He is alert.  Psychiatric:        Mood and Affect: Mood normal.        Behavior: Behavior normal.     Outpatient Encounter Medications as of 11/18/2021  Medication Sig   atorvastatin (LIPITOR) 20 MG tablet Take 1 tablet (20 mg total) by mouth daily.   amLODipine (NORVASC) 5 MG tablet Take 1 tablet (5 mg total) by mouth daily.   aspirin EC 81 MG tablet Take 1 tablet (81 mg total) by mouth daily.   ciprofloxacin-dexamethasone (CIPRODEX) OTIC suspension Place 4 drops into the left ear 2 (two) times daily.   [employer]S VITAMIN B12 1000 MCG tablet TAKE 1 TABLET BY MOUTH EVERY DAY   lansoprazole (PREVACID) 15 MG capsule Take 15 mg by mouth daily at 12 noon.   [DISCONTINUED] atorvastatin (LIPITOR) 10 MG tablet Take 1 tablet (10 mg total) by mouth daily.   [DISCONTINUED] losartan (COZAAR) 50 MG tablet Take 1 tablet (50 mg total) by mouth daily.   No facility-administered encounter medications on file as of 11/18/2021.     Lab Results  Component Value Date   WBC 5.6 05/08/2021   HGB 15.3 05/08/2021   HCT 44.5 05/08/2021   PLT 217.0 05/08/2021   GLUCOSE 106 (H) 11/16/2021   CHOL 134 11/16/2021   TRIG  155.0 (H) 11/16/2021   HDL 30.20 (L) 11/16/2021   LDLDIRECT 95.0 02/28/2020   LDLCALC 72 11/16/2021   ALT 26 11/16/2021   AST 16 11/16/2021   NA 140 11/16/2021   K 4.3  11/16/2021   CL 104 11/16/2021   CREATININE 1.04 11/16/2021   BUN 12 11/16/2021   CO2 28 11/16/2021   TSH 0.87 05/08/2021   PSA 0.33 11/16/2021   HGBA1C 5.5 11/16/2021       Assessment & Plan:   Problem List Items Addressed This Visit     Anxiety    On no medication.  Follow.  Stable.       Blurred vision    On questioning, he describes not being able to see as well close up.  Has to hold the phone further away from his face.  Follow up with ophthalmology.  Also plan neurology f/u.  Does not appear to have any acute vision loss.  Continue daily aspirin.       Essential hypertension, benign    Blood pressure as outlined.  Continue losartan and amlodipine.  Follow pressures.  Follow metabolic panel.       Relevant Medications   atorvastatin (LIPITOR) 20 MG tablet   GERD (gastroesophageal reflux disease)    On 29m prevacid now.  Symptoms controlled.  No changes.  Follow.       Health care maintenance    Physical today 11/18/21.  Colonoscopy 12/25/20 - hemorrhoid - f/u in 10 years.  Check psa.       Hypercholesterolemia - Primary    Continue lipitor.  Low cholesterol diet and exercise.  Follow lipid panel and liver function tests.        Relevant Medications   atorvastatin (LIPITOR) 20 MG tablet   Other Relevant Orders   Basic metabolic panel   Lipid panel   Hepatic function panel   Hyperglycemia    Low carb diet and exercise.  Follow met b and a1c.       Relevant Orders   Hemoglobin A1c   Slurred speech    Has been evaluated previously by neurology.  Persistent intermittent episodes reported.  Continue daily aspirin.  Continue risk factor modification.  If has not had carotid ultrasound - will schedule.  F/u with neurology.        Relevant Orders   Ambulatory referral to Neurology   Tobacco  abuse    Has been wearing a nicotine patch previously.  Continue to stop smoking.         CEinar Pheasant MD

## 2021-11-24 ENCOUNTER — Other Ambulatory Visit: Payer: Self-pay | Admitting: Internal Medicine

## 2021-11-29 ENCOUNTER — Encounter: Payer: Self-pay | Admitting: Internal Medicine

## 2021-11-29 ENCOUNTER — Telehealth: Payer: Self-pay | Admitting: Internal Medicine

## 2021-11-29 NOTE — Assessment & Plan Note (Signed)
Has been evaluated previously by neurology.  Persistent intermittent episodes reported.  Continue daily aspirin.  Continue risk factor modification.  If has not had carotid ultrasound - will schedule.  F/u with neurology.

## 2021-11-29 NOTE — Assessment & Plan Note (Signed)
Blood pressure as outlined.  Continue losartan and amlodipine.  Follow pressures.  Follow metabolic panel.   

## 2021-11-29 NOTE — Assessment & Plan Note (Signed)
On no medication.  Follow.  Stable.

## 2021-11-29 NOTE — Assessment & Plan Note (Signed)
On questioning, he describes not being able to see as well close up.  Has to hold the phone further away from his face.  Follow up with ophthalmology.  Also plan neurology f/u.  Does not appear to have any acute vision loss.  Continue daily aspirin.

## 2021-11-29 NOTE — Assessment & Plan Note (Signed)
On 15mg prevacid now.  Symptoms controlled.  No changes.  Follow.  

## 2021-11-29 NOTE — Telephone Encounter (Signed)
Notify that I have placed the order for neurology referral.  Someone should be contacting him with an appt.  Per review, they had recommended a carotid ultrasound previously.  I do not seen that this was done.  If agreeable, I would like to go ahead and order.

## 2021-11-29 NOTE — Assessment & Plan Note (Signed)
Continue lipitor.  Low cholesterol diet and exercise.  Follow lipid panel and liver function tests.   

## 2021-11-29 NOTE — Assessment & Plan Note (Signed)
Low carb diet and exercise.  Follow met b and a1c.

## 2021-11-29 NOTE — Assessment & Plan Note (Signed)
Has been wearing a nicotine patch previously.  Continue to stop smoking.

## 2021-12-01 NOTE — Telephone Encounter (Signed)
MAILBOX FULL

## 2021-12-09 NOTE — Telephone Encounter (Signed)
Patient aware of below and has declined both neurology and ultrasound. Pt stated someone did call him for neurology and he declined appt and stated that he is not doing an ultrasound right now because he does not want the bill. Advised that insurance should cover some of the cost- patient still declined,

## 2021-12-15 ENCOUNTER — Other Ambulatory Visit: Payer: Self-pay | Admitting: Internal Medicine

## 2022-03-18 ENCOUNTER — Other Ambulatory Visit (INDEPENDENT_AMBULATORY_CARE_PROVIDER_SITE_OTHER): Payer: BC Managed Care – PPO

## 2022-03-18 DIAGNOSIS — Z125 Encounter for screening for malignant neoplasm of prostate: Secondary | ICD-10-CM

## 2022-03-18 DIAGNOSIS — E7800 Pure hypercholesterolemia, unspecified: Secondary | ICD-10-CM

## 2022-03-18 DIAGNOSIS — R739 Hyperglycemia, unspecified: Secondary | ICD-10-CM

## 2022-03-18 LAB — LIPID PANEL
Cholesterol: 128 mg/dL (ref 0–200)
HDL: 33.8 mg/dL — ABNORMAL LOW (ref >39.00)
LDL Cholesterol: 69 mg/dL (ref 0–99)
NonHDL: 94.45
Total CHOL/HDL Ratio: 4
Triglycerides: 128 mg/dL (ref 0.0–149.0)
VLDL: 25.6 mg/dL (ref 0.0–40.0)

## 2022-03-18 LAB — HEPATIC FUNCTION PANEL
ALT: 27 U/L (ref 0–53)
AST: 17 U/L (ref 0–37)
Albumin: 4.8 g/dL (ref 3.5–5.2)
Alkaline Phosphatase: 73 U/L (ref 39–117)
Bilirubin, Direct: 0.2 mg/dL (ref 0.0–0.3)
Total Bilirubin: 1.2 mg/dL (ref 0.2–1.2)
Total Protein: 6.7 g/dL (ref 6.0–8.3)

## 2022-03-18 LAB — PSA, MEDICARE: PSA: 0.29 ng/ml (ref 0.10–4.00)

## 2022-03-18 LAB — BASIC METABOLIC PANEL
BUN: 14 mg/dL (ref 6–23)
CO2: 28 mEq/L (ref 19–32)
Calcium: 9.6 mg/dL (ref 8.4–10.5)
Chloride: 103 mEq/L (ref 96–112)
Creatinine, Ser: 1.09 mg/dL (ref 0.40–1.50)
GFR: 79.63 mL/min (ref >60.00)
Glucose, Bld: 103 mg/dL — ABNORMAL HIGH (ref 70–99)
Potassium: 4 mEq/L (ref 3.5–5.1)
Sodium: 140 mEq/L (ref 135–145)

## 2022-03-18 LAB — HEMOGLOBIN A1C: Hgb A1c MFr Bld: 5.6 % (ref 4.6–6.5)

## 2022-03-19 ENCOUNTER — Ambulatory Visit: Payer: BC Managed Care – PPO | Admitting: Internal Medicine

## 2022-04-05 ENCOUNTER — Ambulatory Visit: Payer: BC Managed Care – PPO | Admitting: Internal Medicine

## 2022-04-05 ENCOUNTER — Encounter: Payer: Self-pay | Admitting: Internal Medicine

## 2022-04-05 DIAGNOSIS — K219 Gastro-esophageal reflux disease without esophagitis: Secondary | ICD-10-CM

## 2022-04-05 DIAGNOSIS — F419 Anxiety disorder, unspecified: Secondary | ICD-10-CM | POA: Diagnosis not present

## 2022-04-05 DIAGNOSIS — I10 Essential (primary) hypertension: Secondary | ICD-10-CM | POA: Diagnosis not present

## 2022-04-05 DIAGNOSIS — Z720 Tobacco use: Secondary | ICD-10-CM

## 2022-04-05 DIAGNOSIS — Z1211 Encounter for screening for malignant neoplasm of colon: Secondary | ICD-10-CM

## 2022-04-05 DIAGNOSIS — R739 Hyperglycemia, unspecified: Secondary | ICD-10-CM

## 2022-04-05 DIAGNOSIS — H538 Other visual disturbances: Secondary | ICD-10-CM

## 2022-04-05 DIAGNOSIS — E7800 Pure hypercholesterolemia, unspecified: Secondary | ICD-10-CM

## 2022-04-05 NOTE — Progress Notes (Signed)
Patient ID: Fernando Harris, male   DOB: 1971-06-01, 51 y.o.   MRN: [id number]   Subjective:    Patient ID: Fernando Harris, male    DOB: 06-19-1971, 51 y.o.   MRN: [id number]   Patient here for a scheduled follow up.   Chief Complaint  Patient presents with   Follow-up    4 mth f/u   .   HPI Here to follow up regarding his blood pressure and cholesterol.  He is doing relatively well.  Stays physically active. Does not do formal exercise.  Discussed diet and exercise.  No chest pain or sob reported.  No abdominal pain.  Bowels moving.  Handling stress.  No episodes of blurred vision.  No headache or dizziness reported.  No slurred speech.    Past Medical History:  Diagnosis Date   Bulging disc    evaluated [facility]   GERD (gastroesophageal reflux disease)    Hypertension    Nephrolithiasis    kidney stones   Past Surgical History:  Procedure Laterality Date   COLONOSCOPY WITH PROPOFOL N/A 12/25/2020   Procedure: COLONOSCOPY WITH PROPOFOL;  Surgeon: Virgel Manifold, MD;  Location: [facility] ENDOSCOPY;  Service: Endoscopy;  Laterality: N/A;   ELBOW SURGERY     INGUINAL HERNIA REPAIR     TONSILLECTOMY     Family History  Problem Relation Age of Onset   Hypertension Father    Diabetes Paternal Grandmother    Heart Problems Paternal Grandmother    Social History   Socioeconomic History   Marital status: Married    Spouse name: Not on file   Number of children: Not on file   Years of education: Not on file   Highest education level: Not on file  Occupational History   Not on file  Tobacco Use   Smoking status: Former    Packs/day: 1.00    Years: 15.00    Total pack years: 15.00    Types: Cigarettes    Quit date: 04/18/2017    Years since quitting: 4.9   Smokeless tobacco: Former    Types: Snuff    Quit date: 08/21/2013  Substance and Sexual Activity   Alcohol use: Yes    Alcohol/week: 0.0 standard drinks of alcohol    Comment: OCCASSIONAL   Drug use:  No   Sexual activity: Not on file  Other Topics Concern   Not on file  Social History Narrative   Married    Daughter    Social Determinants of Health   Financial Resource Strain: Not on file  Food Insecurity: Not on file  Transportation Needs: Not on file  Physical Activity: Not on file  Stress: Not on file  Social Connections: Not on file     Review of Systems  Constitutional:  Negative for appetite change and unexpected weight change.  HENT:  Negative for congestion and sinus pressure.   Respiratory:  Negative for cough, chest tightness and shortness of breath.   Cardiovascular:  Negative for chest pain, palpitations and leg swelling.  Gastrointestinal:  Negative for abdominal pain, diarrhea, nausea and vomiting.  Genitourinary:  Negative for difficulty urinating and dysuria.  Musculoskeletal:  Negative for joint swelling and myalgias.  Skin:  Negative for color change and rash.  Neurological:  Negative for dizziness, light-headedness and headaches.  Psychiatric/Behavioral:  Negative for agitation and dysphoric mood.        Objective:     BP 120/88 (BP Location: Left Arm, Patient Position: Sitting, Cuff Size:  Normal)   Pulse 77   Temp 98.4 F (36.9 C) (Oral)   Resp 17   Ht 5' 10" (1.778 m)   Wt 218 lb 8 oz (99.1 kg)   SpO2 97%   BMI 31.35 kg/m  Wt Readings from Last 3 Encounters:  04/05/22 218 lb 8 oz (99.1 kg)  11/18/21 219 lb (99.3 kg)  05/13/21 214 lb 6.4 oz (97.3 kg)    Physical Exam Constitutional:      General: He is not in acute distress.    Appearance: Normal appearance. He is well-developed.  HENT:     Head: Normocephalic and atraumatic.     Right Ear: External ear normal.     Left Ear: External ear normal.  Eyes:     General: No scleral icterus.       Right eye: No discharge.        Left eye: No discharge.  Cardiovascular:     Rate and Rhythm: Normal rate and regular rhythm.  Pulmonary:     Effort: Pulmonary effort is normal. No  respiratory distress.     Breath sounds: Normal breath sounds.  Abdominal:     General: Bowel sounds are normal.     Palpations: Abdomen is soft.     Tenderness: There is no abdominal tenderness.  Musculoskeletal:        General: No swelling or tenderness.     Cervical back: Neck supple. No tenderness.  Lymphadenopathy:     Cervical: No cervical adenopathy.  Skin:    Findings: No erythema or rash.  Neurological:     Mental Status: He is alert.  Psychiatric:        Mood and Affect: Mood normal.        Behavior: Behavior normal.      Outpatient Encounter Medications as of 04/05/2022  Medication Sig   amLODipine (NORVASC) 5 MG tablet TAKE 1 TABLET (5 MG TOTAL) BY MOUTH DAILY.   aspirin EC 81 MG tablet Take 1 tablet (81 mg total) by mouth daily.   atorvastatin (LIPITOR) 20 MG tablet Take 1 tablet (20 mg total) by mouth daily.   [facility] VITAMIN B12 1000 MCG tablet TAKE 1 TABLET BY MOUTH EVERY DAY   lansoprazole (PREVACID) 15 MG capsule Take 15 mg by mouth daily at 12 noon.   losartan (COZAAR) 50 MG tablet TAKE 1 TABLET BY MOUTH EVERY DAY   [DISCONTINUED] ciprofloxacin-dexamethasone (CIPRODEX) OTIC suspension Place 4 drops into the left ear 2 (two) times daily. (Patient not taking: Reported on 04/05/2022)   No facility-administered encounter medications on file as of 04/05/2022.     Lab Results  Component Value Date   WBC 5.6 05/08/2021   HGB 15.3 05/08/2021   HCT 44.5 05/08/2021   PLT 217.0 05/08/2021   GLUCOSE 103 (H) 03/18/2022   CHOL 128 03/18/2022   TRIG 128.0 03/18/2022   HDL 33.80 (L) 03/18/2022   LDLDIRECT 95.0 02/28/2020   LDLCALC 69 03/18/2022   ALT 27 03/18/2022   AST 17 03/18/2022   NA 140 03/18/2022   K 4.0 03/18/2022   CL 103 03/18/2022   CREATININE 1.09 03/18/2022   BUN 14 03/18/2022   CO2 28 03/18/2022   TSH 0.87 05/08/2021   PSA 0.29 03/18/2022   HGBA1C 5.6 03/18/2022       Assessment & Plan:   Problem List Items Addressed This Visit     Anxiety     On no medication.  Stable.  Follow.  Blurred vision    No episodes of vision change.  Discussed f/u with neurology and carotid ultrasound.  Declines.  Wants to follow.  Continue daily aspirin.       Colon cancer screening    Colonoscopy 12/25/2020 revealed hemorrhoids and diverticulosis.  Recommended follow-up in 10 years.      Essential hypertension, benign    Blood pressure as outlined.  Elevated diastolic. Continue losartan and amlodipine.  Follow pressures. Send in readings over the next few weeks.  Will hold today making changes, but if persistent elevation will need to adjust. Follow metabolic panel.       GERD (gastroesophageal reflux disease)    No upper symptoms reported.  On prevacid.       Hypercholesterolemia    Continue lipitor.  Low cholesterol diet and exercise.  Follow lipid panel and liver function tests.        Hyperglycemia    Low carb diet and exercise.  Follow met b and a1c.  Lab Results  Component Value Date   HGBA1C 5.6 03/18/2022       Tobacco abuse    Not smoking.  Wearing nicotine patch. Discussed the need to taper the patch to off.  Follow.         Einar Pheasant, MD

## 2022-04-05 NOTE — Assessment & Plan Note (Signed)
No upper symptoms reported.  On prevacid.  

## 2022-04-05 NOTE — Assessment & Plan Note (Signed)
Low carb diet and exercise.  Follow met b and a1c.  Lab Results  Component Value Date   HGBA1C 5.6 03/18/2022   

## 2022-04-05 NOTE — Assessment & Plan Note (Signed)
On no medication.  Stable.  Follow.    

## 2022-04-05 NOTE — Assessment & Plan Note (Signed)
Blood pressure as outlined.  Elevated diastolic. Continue losartan and amlodipine.  Follow pressures. Send in readings over the next few weeks.  Will hold today making changes, but if persistent elevation will need to adjust. Follow metabolic panel.

## 2022-04-05 NOTE — Assessment & Plan Note (Signed)
Colonoscopy 12/25/2020 revealed hemorrhoids and diverticulosis.  Recommended follow-up in 10 years. 

## 2022-04-05 NOTE — Assessment & Plan Note (Signed)
Not smoking.  Wearing nicotine patch. Discussed the need to taper the patch to off.  Follow.

## 2022-04-05 NOTE — Assessment & Plan Note (Signed)
Continue lipitor.  Low cholesterol diet and exercise.  Follow lipid panel and liver function tests.   

## 2022-04-05 NOTE — Assessment & Plan Note (Signed)
No episodes of vision change.  Discussed f/u with neurology and carotid ultrasound.  Declines.  Wants to follow.  Continue daily aspirin.

## 2022-04-10 ENCOUNTER — Other Ambulatory Visit: Payer: Self-pay

## 2022-04-10 ENCOUNTER — Emergency Department: Payer: BC Managed Care – PPO

## 2022-04-10 ENCOUNTER — Emergency Department
Admission: EM | Admit: 2022-04-10 | Discharge: 2022-04-10 | Disposition: A | Discharge status: Home / Self Care | Payer: BC Managed Care – PPO | Attending: Emergency Medicine | Admitting: Emergency Medicine

## 2022-04-10 ENCOUNTER — Encounter: Payer: Self-pay | Admitting: Emergency Medicine

## 2022-04-10 DIAGNOSIS — S52501A Unspecified fracture of the lower end of right radius, initial encounter for closed fracture: Secondary | ICD-10-CM | POA: Diagnosis not present

## 2022-04-10 DIAGNOSIS — Y9389 Activity, other specified: Secondary | ICD-10-CM | POA: Diagnosis not present

## 2022-04-10 DIAGNOSIS — M25531 Pain in right wrist: Secondary | ICD-10-CM | POA: Diagnosis not present

## 2022-04-10 DIAGNOSIS — Z79899 Other long term (current) drug therapy: Secondary | ICD-10-CM | POA: Diagnosis not present

## 2022-04-10 DIAGNOSIS — Z87891 Personal history of nicotine dependence: Secondary | ICD-10-CM | POA: Diagnosis not present

## 2022-04-10 DIAGNOSIS — Z7982 Long term (current) use of aspirin: Secondary | ICD-10-CM | POA: Diagnosis not present

## 2022-04-10 DIAGNOSIS — I10 Essential (primary) hypertension: Secondary | ICD-10-CM | POA: Diagnosis not present

## 2022-04-10 DIAGNOSIS — S6991XA Unspecified injury of right wrist, hand and finger(s), initial encounter: Secondary | ICD-10-CM | POA: Diagnosis not present

## 2022-04-10 MED ORDER — OXYCODONE-ACETAMINOPHEN 5-325 MG PO TABS: 1.0000 | ORAL_TABLET | Freq: Four times a day (QID) | ORAL | 0 refills | Status: AC | PRN

## 2022-04-10 MED ORDER — OXYCODONE-ACETAMINOPHEN 5-325 MG PO TABS
1.0000 | ORAL_TABLET | Freq: Once | ORAL | Status: AC
  Administered 2022-04-10: 1 via ORAL
  Filled 2022-04-10: qty 1

## 2022-04-10 MED ORDER — ONDANSETRON 4 MG PO TBDP
4.0000 mg | ORAL_TABLET | Freq: Once | ORAL | Status: AC
  Administered 2022-04-10: 4 mg via ORAL
  Filled 2022-04-10: qty 1

## 2022-04-10 NOTE — ED Notes (Addendum)
Pt discharged prior to RN assessment. Pt verbalized understanding of discharge instructions, prescriptions, and follow-up care instructions. Pt advised if symptoms worsen to return to ED. 

## 2022-04-10 NOTE — ED Provider Notes (Signed)
[facility] Emergency Department Provider Note   ____________________________________________   Event Date/Time   First MD Initiated Contact with Patient 04/10/22 1950     (approximate)  I have reviewed the triage vital signs and the nursing notes.   HISTORY  Chief Complaint Wrist Pain    HPI Fernando Harris is a 51 y.o. male presents to the emergency room with complaint of right wrist pain.  Patient reports that he was attempting to ride a skateboard and fell off the skateboard catching himself with his right wrist.  Since that time he has been having significant amount of right wrist pain with any range of motion.  Patient reports that the pain is a 10 out of 10 pain that is sharp/shooting/aching in nature.  He has not taken anything for the pain at this time.  Past Medical History:  Diagnosis Date   Bulging disc    evaluated [facility]   GERD (gastroesophageal reflux disease)    Hypertension    Nephrolithiasis    kidney stones    Patient Active Problem List   Diagnosis Date Noted   Soft tissue mass 02/01/2021   Colon cancer screening    GERD (gastroesophageal reflux disease) 10/06/2020   Carotid stenosis, bilateral 04/08/2020   Blurred vision 03/31/2020   Paraphasia 03/31/2020   Slurred speech 03/17/2020   Increased heart rate 02/28/2020   Hyperglycemia 02/28/2020   Urinary frequency 08/28/2019   Febrile illness 01/30/2019   Skin lesion of back 11/18/2018   Neuropathy 12/27/2016   External hemorrhoid 09/13/2016   Right arm pain 06/07/2016   Neck pain 10/06/2015   Nasal congestion 10/06/2015   Hypercholesterolemia 04/29/2015   Tobacco abuse 04/29/2015   Health care maintenance 01/02/2015   Abnormal CXR 08/05/2014   Chest pain 01/18/2014   Anxiety 09/23/2013   Abrasion of arm, left 08/26/2013   Rash 07/09/2013   Kidney stones 04/22/2013   Essential hypertension, benign 04/22/2013   Cough 04/22/2013   Back pain 04/22/2013     Past Surgical History:  Procedure Laterality Date   COLONOSCOPY WITH PROPOFOL N/A 12/25/2020   Procedure: COLONOSCOPY WITH PROPOFOL;  Surgeon: Pasty Spillers, MD;  Location: [facility] ENDOSCOPY;  Service: Endoscopy;  Laterality: N/A;   ELBOW SURGERY     INGUINAL HERNIA REPAIR     TONSILLECTOMY      Prior to Admission medications   Medication Sig Start Date End Date Taking? Authorizing Provider  oxyCODONE-acetaminophen (PERCOCET) 5-325 MG tablet Take 1 tablet by mouth every 6 (six) hours as needed for up to 5 days for severe pain. 04/10/22 04/15/22 Yes Herschell Dimes, NP  amLODipine (NORVASC) 5 MG tablet TAKE 1 TABLET (5 MG TOTAL) BY MOUTH DAILY. 12/15/21   Dale [place], MD  aspirin EC 81 MG tablet Take 1 tablet (81 mg total) by mouth daily. 03/03/20   Debbe Odea, MD  atorvastatin (LIPITOR) 20 MG tablet Take 1 tablet (20 mg total) by mouth daily. 11/18/21   Dale Baneberry, MD  [facility]S VITAMIN B12 1000 MCG tablet TAKE 1 TABLET BY MOUTH EVERY DAY 04/13/18   Dale [place], MD  lansoprazole (PREVACID) 15 MG capsule Take 15 mg by mouth daily at 12 noon.    [provider]  losartan (COZAAR) 50 MG tablet TAKE 1 TABLET BY MOUTH EVERY DAY 11/24/21   Dale [place], MD    Allergies Aleve [naproxen]  Family History  Problem Relation Age of Onset   Hypertension Father    Diabetes Paternal Grandmother  Heart Problems Paternal Grandmother     Social History Social History   Tobacco Use   Smoking status: Former    Packs/day: 1.00    Years: 15.00    Total pack years: 15.00    Types: Cigarettes    Quit date: 04/18/2017    Years since quitting: 4.9   Smokeless tobacco: Former    Types: Snuff    Quit date: 08/21/2013  Substance Use Topics   Alcohol use: Yes    Alcohol/week: 0.0 standard drinks of alcohol    Comment: OCCASSIONAL   Drug use: No    Review of Systems  Constitutional: No fever/chills Eyes: No visual changes. ENT: No sore throat. Cardiovascular:  Denies chest pain. Respiratory: Denies shortness of breath. Gastrointestinal: No abdominal pain.  No nausea, no vomiting.  No diarrhea.  No constipation. Genitourinary: Negative for dysuria. Musculoskeletal: Positive for right wrist pain Skin: Negative for rash. Neurological: Negative for headaches, focal weakness or numbness.   ____________________________________________   PHYSICAL EXAM:  VITAL SIGNS: ED Triage Vitals [04/10/22 1930]  Enc Vitals Group     BP 114/73     Pulse Rate 84     Resp (!) 22     Temp 98.6 F (37 C)     Temp Source Oral     SpO2 95 %     Weight 218 lb 8 oz (99.1 kg)     Height 5\' 10"  (1.778 m)     Head Circumference      Peak Flow      Pain Score 8     Pain Loc      Pain Edu?      Excl. in GC?     Constitutional: Alert and oriented. Well appearing and in no acute distress. Eyes: Conjunctivae are normal. PERRL. EOMI. Head: Atraumatic. Nose: No congestion/rhinnorhea. Mouth/Throat: Mucous membranes are moist.  Oropharynx non-erythematous. Neck: No stridor.   Cardiovascular: Normal rate, regular rhythm. Grossly normal heart sounds.  Good peripheral circulation. Respiratory: Normal respiratory effort.  No retractions. Lungs CTAB. Gastrointestinal: Soft and nontender. No distention. No abdominal bruits. No CVA tenderness. Musculoskeletal: Patient has redness/swelling/bruising to the medial aspect of right wrist.  He has decreased range of motion to the right wrist.  Patient has full range of motion to his fingers.  Patient has good radial pulse.  Patient's cap refill is less than 2 seconds.  Patient has good sensation to right forearm/hand/fingers. Neurologic:  Normal speech and language. No gross focal neurologic deficits are appreciated. No gait instability. Skin:  Skin is warm, dry and intact. No rash noted. Psychiatric: Mood and affect are normal. Speech and behavior are normal.  ____________________________________________   LABS (all labs  ordered are listed, but only abnormal results are displayed)  Labs Reviewed - No data to display ____________________________________________  EKG   ____________________________________________  RADIOLOGY  ED MD interpretation: X-ray of right wrist was reviewed by me and read by radiology.  Official radiology report(s): DG Wrist Complete Right  Result Date: 04/10/2022 CLINICAL DATA:  Trauma, fall EXAM: RIGHT WRIST - COMPLETE 3+ VIEW COMPARISON:  None Available. FINDINGS: No displaced fracture or dislocation is seen. In the lateral view there is lobulation in the dorsal cortical margin of distal radius without radiolucent fracture line. Small bony spurs seen in first carpometacarpal and first metacarpophalangeal joints. IMPRESSION: No recent displaced fracture or dislocation is seen in the right wrist. Lobulation seen in the dorsal cortical margin of distal radius without radiolucent fracture line may be normal  variation or residual change from previous injury. Less likely possibility would be recent undisplaced fracture. Electronically Signed   By: Ernie Avena M.D.   On: 04/10/2022 20:10    ____________________________________________   PROCEDURES  Procedure(s) performed: None  Procedures  Critical Care performed: No  ____________________________________________   INITIAL IMPRESSION / ASSESSMENT AND PLAN / ED COURSE   Fernando Harris is a 51 y.o. male presents to the emergency room with complaint of right wrist pain.  Patient reports that he was attempting to ride a skateboard and fell off the skateboard catching himself with his right wrist.  Since that time he has been having significant amount of right wrist pain with any range of motion.  Patient reports that the pain is a 10 out of 10 pain that is sharp/shooting/aching in nature.  He has not taken anything for the pain at this time.  X-ray of right wrist was obtained. X-ray right wrist shows no definitive fracture  of the distal radius.  However, there is question that it could be an undisplaced fracture.  Based on patient's presentation, I will treat as though it is a distal radius fracture.  I have discussed this with Dr. Scotty Court and we reviewed the x-ray together and he feels this is appropriate.  I will have patient placed in Volnar short arm splint and have him to follow-up with orthopedics next week. He is given the information for orthopedic follow-up. I will give him a Percocet while he is here in the emergency room as it is too late for him to obtain from the pharmacy tonight.  I will also give him Zofran as he is feeling nauseated from the pain. I will send in a prescription for him to have Percocet for a short course until he can see orthopedics in follow-up. His wife is with him in the emergency room therefore she can drive him home. Patient we discharged home in stable condition at this time.       ____________________________________________   FINAL CLINICAL IMPRESSION(S) / ED DIAGNOSES  Final diagnoses:  Closed fracture of distal end of right radius, unspecified fracture morphology, initial encounter     ED Discharge Orders          Ordered    oxyCODONE-acetaminophen (PERCOCET) 5-325 MG tablet  Every 6 hours PRN        04/10/22 2056             Note:  This document was prepared using Dragon voice recognition software and may include unintentional dictation errors.     Herschell Dimes, NP 04/10/22 2133    Gilles Chiquito, MD 04/11/22 [phone]

## 2022-04-10 NOTE — Discharge Instructions (Signed)
You have been seen today in the emergency room for wrist injury. It is questionable as to whether the right wrist is fractured or not.  Based on review of your x-ray I have chosen to treat it as though you have a radial fracture.  You will be splinted and should follow-up with orthopedics Monday.  I have provided you with a short course of Percocet for pain.

## 2022-04-10 NOTE — ED Triage Notes (Signed)
Pt to ED via POV with c/o R wrist pain, pt states jumped on a skateboard and it came out from under him. Pt noted to be holding his R wrist in triage. Ice pack applied.

## 2022-04-10 NOTE — ED Notes (Signed)
Pt declined discharge vital signs  

## 2022-05-24 ENCOUNTER — Other Ambulatory Visit: Payer: Self-pay | Admitting: Internal Medicine

## 2022-05-29 DIAGNOSIS — W39XXXA Discharge of firework, initial encounter: Secondary | ICD-10-CM | POA: Diagnosis not present

## 2022-05-29 DIAGNOSIS — T23252A Burn of second degree of left palm, initial encounter: Secondary | ICD-10-CM | POA: Diagnosis not present

## 2022-05-29 DIAGNOSIS — E7800 Pure hypercholesterolemia, unspecified: Secondary | ICD-10-CM | POA: Diagnosis not present

## 2022-05-29 DIAGNOSIS — I10 Essential (primary) hypertension: Secondary | ICD-10-CM | POA: Diagnosis not present

## 2022-05-29 DIAGNOSIS — T23202A Burn of second degree of left hand, unspecified site, initial encounter: Secondary | ICD-10-CM | POA: Diagnosis not present

## 2022-05-29 DIAGNOSIS — T25229A Burn of second degree of unspecified foot, initial encounter: Secondary | ICD-10-CM | POA: Diagnosis not present

## 2022-05-29 DIAGNOSIS — Z6830 Body mass index (BMI) 30.0-30.9, adult: Secondary | ICD-10-CM | POA: Diagnosis not present

## 2022-05-29 DIAGNOSIS — T310 Burns involving less than 10% of body surface: Secondary | ICD-10-CM | POA: Diagnosis not present

## 2022-05-29 DIAGNOSIS — T23259A Burn of second degree of unspecified palm, initial encounter: Secondary | ICD-10-CM | POA: Diagnosis not present

## 2022-05-29 DIAGNOSIS — T23272A Burn of second degree of left wrist, initial encounter: Secondary | ICD-10-CM | POA: Diagnosis not present

## 2022-05-29 DIAGNOSIS — T23232A Burn of second degree of multiple left fingers (nail), not including thumb, initial encounter: Secondary | ICD-10-CM | POA: Diagnosis not present

## 2022-05-29 DIAGNOSIS — T23292A Burn of second degree of multiple sites of left wrist and hand, initial encounter: Secondary | ICD-10-CM | POA: Diagnosis not present

## 2022-05-29 DIAGNOSIS — F419 Anxiety disorder, unspecified: Secondary | ICD-10-CM | POA: Diagnosis not present

## 2022-05-29 DIAGNOSIS — K219 Gastro-esophageal reflux disease without esophagitis: Secondary | ICD-10-CM | POA: Diagnosis not present

## 2022-05-30 DIAGNOSIS — T23202A Burn of second degree of left hand, unspecified site, initial encounter: Secondary | ICD-10-CM | POA: Diagnosis not present

## 2022-05-30 DIAGNOSIS — T23292A Burn of second degree of multiple sites of left wrist and hand, initial encounter: Secondary | ICD-10-CM | POA: Diagnosis not present

## 2022-05-30 DIAGNOSIS — T310 Burns involving less than 10% of body surface: Secondary | ICD-10-CM | POA: Diagnosis not present

## 2022-06-02 ENCOUNTER — Encounter: Payer: Self-pay | Admitting: Internal Medicine

## 2022-06-02 DIAGNOSIS — T23209A Burn of second degree of unspecified hand, unspecified site, initial encounter: Secondary | ICD-10-CM | POA: Insufficient documentation

## 2022-06-08 DIAGNOSIS — I10 Essential (primary) hypertension: Secondary | ICD-10-CM | POA: Diagnosis not present

## 2022-06-08 DIAGNOSIS — K219 Gastro-esophageal reflux disease without esophagitis: Secondary | ICD-10-CM | POA: Diagnosis not present

## 2022-06-08 DIAGNOSIS — T23241A Burn of second degree of multiple right fingers (nail), including thumb, initial encounter: Secondary | ICD-10-CM | POA: Diagnosis not present

## 2022-06-08 DIAGNOSIS — E7800 Pure hypercholesterolemia, unspecified: Secondary | ICD-10-CM | POA: Diagnosis not present

## 2022-06-08 DIAGNOSIS — W39XXXD Discharge of firework, subsequent encounter: Secondary | ICD-10-CM | POA: Diagnosis not present

## 2022-06-08 DIAGNOSIS — T23241D Burn of second degree of multiple right fingers (nail), including thumb, subsequent encounter: Secondary | ICD-10-CM | POA: Diagnosis not present

## 2022-06-08 DIAGNOSIS — Z6830 Body mass index (BMI) 30.0-30.9, adult: Secondary | ICD-10-CM | POA: Diagnosis not present

## 2022-07-06 ENCOUNTER — Other Ambulatory Visit: Payer: Self-pay | Admitting: Internal Medicine

## 2022-07-07 ENCOUNTER — Ambulatory Visit: Payer: BC Managed Care – PPO | Admitting: Internal Medicine

## 2022-07-07 ENCOUNTER — Encounter: Payer: Self-pay | Admitting: Internal Medicine

## 2022-07-07 VITALS — BP 120/88 | HR 73 | Temp 98.3°F | Resp 17 | Ht 70.0 in | Wt 215.8 lb

## 2022-07-07 DIAGNOSIS — I10 Essential (primary) hypertension: Secondary | ICD-10-CM | POA: Diagnosis not present

## 2022-07-07 DIAGNOSIS — E7800 Pure hypercholesterolemia, unspecified: Secondary | ICD-10-CM

## 2022-07-07 DIAGNOSIS — R739 Hyperglycemia, unspecified: Secondary | ICD-10-CM | POA: Diagnosis not present

## 2022-07-07 DIAGNOSIS — T23209D Burn of second degree of unspecified hand, unspecified site, subsequent encounter: Secondary | ICD-10-CM

## 2022-07-07 DIAGNOSIS — K219 Gastro-esophageal reflux disease without esophagitis: Secondary | ICD-10-CM

## 2022-07-07 DIAGNOSIS — M79601 Pain in right arm: Secondary | ICD-10-CM

## 2022-07-07 DIAGNOSIS — Z1211 Encounter for screening for malignant neoplasm of colon: Secondary | ICD-10-CM

## 2022-07-07 LAB — CBC WITH DIFFERENTIAL/PLATELET
Basophils Absolute: 0 10*3/uL (ref 0.0–0.1)
Basophils Relative: 0.5 % (ref 0.0–3.0)
Eosinophils Absolute: 0.1 10*3/uL (ref 0.0–0.7)
Eosinophils Relative: 2.3 % (ref 0.0–5.0)
HCT: 44.4 % (ref 39.0–52.0)
Hemoglobin: 15 g/dL (ref 13.0–17.0)
Lymphocytes Relative: 21.3 % (ref 12.0–46.0)
Lymphs Abs: 1.2 10*3/uL (ref 0.7–4.0)
MCHC: 33.8 g/dL (ref 30.0–36.0)
MCV: 90.9 fl (ref 78.0–100.0)
Monocytes Absolute: 0.5 10*3/uL (ref 0.1–1.0)
Monocytes Relative: 8.1 % (ref 3.0–12.0)
Neutro Abs: 4 10*3/uL (ref 1.4–7.7)
Neutrophils Relative %: 67.8 % (ref 43.0–77.0)
Platelets: 219 10*3/uL (ref 150.0–400.0)
RBC: 4.88 Mil/uL (ref 4.22–5.81)
RDW: 13.2 % (ref 11.5–15.5)
WBC: 5.8 10*3/uL (ref 4.0–10.5)

## 2022-07-07 LAB — LIPID PANEL
Cholesterol: 123 mg/dL (ref 0–200)
HDL: 34.7 mg/dL — ABNORMAL LOW (ref >39.00)
LDL Cholesterol: 68 mg/dL (ref 0–99)
NonHDL: 88.76
Total CHOL/HDL Ratio: 4
Triglycerides: 105 mg/dL (ref 0.0–149.0)
VLDL: 21 mg/dL (ref 0.0–40.0)

## 2022-07-07 LAB — BASIC METABOLIC PANEL
BUN: 11 mg/dL (ref 6–23)
CO2: 26 mEq/L (ref 19–32)
Calcium: 9.5 mg/dL (ref 8.4–10.5)
Chloride: 104 mEq/L (ref 96–112)
Creatinine, Ser: 0.95 mg/dL (ref 0.40–1.50)
GFR: 93.71 mL/min (ref >60.00)
Glucose, Bld: 101 mg/dL — ABNORMAL HIGH (ref 70–99)
Potassium: 4.3 mEq/L (ref 3.5–5.1)
Sodium: 139 mEq/L (ref 135–145)

## 2022-07-07 LAB — HEMOGLOBIN A1C: Hgb A1c MFr Bld: 5.8 % (ref 4.6–6.5)

## 2022-07-07 LAB — HEPATIC FUNCTION PANEL
ALT: 23 U/L (ref 0–53)
AST: 15 U/L (ref 0–37)
Albumin: 4.4 g/dL (ref 3.5–5.2)
Alkaline Phosphatase: 80 U/L (ref 39–117)
Bilirubin, Direct: 0.1 mg/dL (ref 0.0–0.3)
Total Bilirubin: 0.6 mg/dL (ref 0.2–1.2)
Total Protein: 6.6 g/dL (ref 6.0–8.3)

## 2022-07-07 LAB — TSH: TSH: 0.43 u[IU]/mL (ref 0.35–5.50)

## 2022-07-07 MED ORDER — LOSARTAN POTASSIUM 100 MG PO TABS: 100.0000 mg | ORAL_TABLET | Freq: Every day | ORAL | 1 refills | Status: DC

## 2022-07-07 MED ORDER — METHYLPREDNISOLONE 4 MG PO TBPK: ORAL_TABLET | ORAL | 0 refills | Status: DC

## 2022-07-07 NOTE — Assessment & Plan Note (Signed)
Continue lipitor.  Low cholesterol diet and exercise.  Follow lipid panel and liver function tests.   

## 2022-07-07 NOTE — Assessment & Plan Note (Signed)
Blood pressure as outlined.  Elevated diastolic. Continue losartan and amlodipine, but will increase losartan to 100mg  q day. Follow pressures.  Follow metabolic panel.

## 2022-07-07 NOTE — Assessment & Plan Note (Signed)
Right shoulder pain and arm pain as outlined.  Discussed neck/shouder involvement.  Exam as outlined.  Medrol dosepak - 6 day taper.  Refer to ortho for evaluation.  Continue exercises.  Has previously been to therapy.

## 2022-07-07 NOTE — Assessment & Plan Note (Signed)
Colonoscopy 12/25/2020 revealed hemorrhoids and diverticulosis.  Recommended follow-up in 10 years.
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
## 2022-07-07 NOTE — Assessment & Plan Note (Signed)
Low carb diet and exercise.  Follow met b and a1c.  Lab Results  Component Value Date   HGBA1C 5.6 03/18/2022
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
## 2022-07-07 NOTE — Assessment & Plan Note (Signed)
No upper symptoms reported.  On prevacid.  

## 2022-07-15 DIAGNOSIS — M7541 Impingement syndrome of right shoulder: Secondary | ICD-10-CM | POA: Diagnosis not present

## 2022-07-15 DIAGNOSIS — M7551 Bursitis of right shoulder: Secondary | ICD-10-CM | POA: Diagnosis not present

## 2022-07-15 DIAGNOSIS — M25511 Pain in right shoulder: Secondary | ICD-10-CM | POA: Diagnosis not present

## 2022-07-15 DIAGNOSIS — M50322 Other cervical disc degeneration at C5-C6 level: Secondary | ICD-10-CM | POA: Diagnosis not present

## 2022-07-15 DIAGNOSIS — M778 Other enthesopathies, not elsewhere classified: Secondary | ICD-10-CM | POA: Diagnosis not present

## 2022-07-20 DIAGNOSIS — S46011A Strain of muscle(s) and tendon(s) of the rotator cuff of right shoulder, initial encounter: Secondary | ICD-10-CM | POA: Diagnosis not present

## 2022-07-20 DIAGNOSIS — M9901 Segmental and somatic dysfunction of cervical region: Secondary | ICD-10-CM | POA: Diagnosis not present

## 2022-07-20 DIAGNOSIS — M5412 Radiculopathy, cervical region: Secondary | ICD-10-CM | POA: Diagnosis not present

## 2022-07-20 DIAGNOSIS — M9907 Segmental and somatic dysfunction of upper extremity: Secondary | ICD-10-CM | POA: Diagnosis not present

## 2022-07-20 DIAGNOSIS — M9902 Segmental and somatic dysfunction of thoracic region: Secondary | ICD-10-CM | POA: Diagnosis not present

## 2022-07-20 DIAGNOSIS — M62830 Muscle spasm of back: Secondary | ICD-10-CM | POA: Diagnosis not present

## 2022-07-22 DIAGNOSIS — S46011A Strain of muscle(s) and tendon(s) of the rotator cuff of right shoulder, initial encounter: Secondary | ICD-10-CM | POA: Diagnosis not present

## 2022-07-22 DIAGNOSIS — M5412 Radiculopathy, cervical region: Secondary | ICD-10-CM | POA: Diagnosis not present

## 2022-07-22 DIAGNOSIS — M9902 Segmental and somatic dysfunction of thoracic region: Secondary | ICD-10-CM | POA: Diagnosis not present

## 2022-07-22 DIAGNOSIS — M9907 Segmental and somatic dysfunction of upper extremity: Secondary | ICD-10-CM | POA: Diagnosis not present

## 2022-07-22 DIAGNOSIS — M62830 Muscle spasm of back: Secondary | ICD-10-CM | POA: Diagnosis not present

## 2022-07-22 DIAGNOSIS — M9901 Segmental and somatic dysfunction of cervical region: Secondary | ICD-10-CM | POA: Diagnosis not present

## 2022-08-02 ENCOUNTER — Ambulatory Visit
Admission: EM | Admit: 2022-08-02 | Discharge: 2022-08-02 | Disposition: A | Discharge status: Home / Self Care | Payer: BC Managed Care – PPO

## 2022-08-02 DIAGNOSIS — M795 Residual foreign body in soft tissue: Secondary | ICD-10-CM | POA: Diagnosis not present

## 2022-08-02 NOTE — ED Triage Notes (Signed)
Pt. States he injured his right thumb at work today w/ a power tool Pt. Endorses pain and a foreign plastic object in his right thumb.
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
## 2022-08-02 NOTE — Discharge Instructions (Addendum)
Soak twice daily in warm water with dissolved Epsom salts and redress with antibacterial ointment and nonadhesive dressing.  Watch for signs and symptoms of infection including warmth at the site, swelling, redness, discharge, increased pain.  Follow up here or with your primary care provider if your symptoms are worsening or not improving with treatment.

## 2022-08-03 DIAGNOSIS — M62830 Muscle spasm of back: Secondary | ICD-10-CM | POA: Diagnosis not present

## 2022-08-03 DIAGNOSIS — S46011A Strain of muscle(s) and tendon(s) of the rotator cuff of right shoulder, initial encounter: Secondary | ICD-10-CM | POA: Diagnosis not present

## 2022-08-03 DIAGNOSIS — M5412 Radiculopathy, cervical region: Secondary | ICD-10-CM | POA: Diagnosis not present

## 2022-08-03 DIAGNOSIS — M9902 Segmental and somatic dysfunction of thoracic region: Secondary | ICD-10-CM | POA: Diagnosis not present

## 2022-08-03 DIAGNOSIS — M9907 Segmental and somatic dysfunction of upper extremity: Secondary | ICD-10-CM | POA: Diagnosis not present

## 2022-08-03 DIAGNOSIS — M9901 Segmental and somatic dysfunction of cervical region: Secondary | ICD-10-CM | POA: Diagnosis not present

## 2022-08-05 DIAGNOSIS — M9901 Segmental and somatic dysfunction of cervical region: Secondary | ICD-10-CM | POA: Diagnosis not present

## 2022-08-05 DIAGNOSIS — S46011A Strain of muscle(s) and tendon(s) of the rotator cuff of right shoulder, initial encounter: Secondary | ICD-10-CM | POA: Diagnosis not present

## 2022-08-05 DIAGNOSIS — M5412 Radiculopathy, cervical region: Secondary | ICD-10-CM | POA: Diagnosis not present

## 2022-08-05 DIAGNOSIS — M9907 Segmental and somatic dysfunction of upper extremity: Secondary | ICD-10-CM | POA: Diagnosis not present

## 2022-08-05 DIAGNOSIS — M62830 Muscle spasm of back: Secondary | ICD-10-CM | POA: Diagnosis not present

## 2022-08-05 DIAGNOSIS — M9902 Segmental and somatic dysfunction of thoracic region: Secondary | ICD-10-CM | POA: Diagnosis not present
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
## 2022-08-10 DIAGNOSIS — S46011A Strain of muscle(s) and tendon(s) of the rotator cuff of right shoulder, initial encounter: Secondary | ICD-10-CM | POA: Diagnosis not present

## 2022-08-10 DIAGNOSIS — M9902 Segmental and somatic dysfunction of thoracic region: Secondary | ICD-10-CM | POA: Diagnosis not present

## 2022-08-10 DIAGNOSIS — M9907 Segmental and somatic dysfunction of upper extremity: Secondary | ICD-10-CM | POA: Diagnosis not present

## 2022-08-10 DIAGNOSIS — M9901 Segmental and somatic dysfunction of cervical region: Secondary | ICD-10-CM | POA: Diagnosis not present

## 2022-08-10 DIAGNOSIS — M5412 Radiculopathy, cervical region: Secondary | ICD-10-CM | POA: Diagnosis not present

## 2022-08-10 DIAGNOSIS — M62830 Muscle spasm of back: Secondary | ICD-10-CM | POA: Diagnosis not present

## 2022-08-12 DIAGNOSIS — M9902 Segmental and somatic dysfunction of thoracic region: Secondary | ICD-10-CM | POA: Diagnosis not present

## 2022-08-12 DIAGNOSIS — M9901 Segmental and somatic dysfunction of cervical region: Secondary | ICD-10-CM | POA: Diagnosis not present

## 2022-08-12 DIAGNOSIS — M9907 Segmental and somatic dysfunction of upper extremity: Secondary | ICD-10-CM | POA: Diagnosis not present

## 2022-08-12 DIAGNOSIS — M5412 Radiculopathy, cervical region: Secondary | ICD-10-CM | POA: Diagnosis not present

## 2022-08-12 DIAGNOSIS — S46011A Strain of muscle(s) and tendon(s) of the rotator cuff of right shoulder, initial encounter: Secondary | ICD-10-CM | POA: Diagnosis not present

## 2022-08-12 DIAGNOSIS — M62830 Muscle spasm of back: Secondary | ICD-10-CM | POA: Diagnosis not present

## 2022-08-19 DIAGNOSIS — M62830 Muscle spasm of back: Secondary | ICD-10-CM | POA: Diagnosis not present

## 2022-08-19 DIAGNOSIS — S46011A Strain of muscle(s) and tendon(s) of the rotator cuff of right shoulder, initial encounter: Secondary | ICD-10-CM | POA: Diagnosis not present

## 2022-08-19 DIAGNOSIS — M9901 Segmental and somatic dysfunction of cervical region: Secondary | ICD-10-CM | POA: Diagnosis not present

## 2022-08-19 DIAGNOSIS — M9902 Segmental and somatic dysfunction of thoracic region: Secondary | ICD-10-CM | POA: Diagnosis not present

## 2022-08-19 DIAGNOSIS — M9907 Segmental and somatic dysfunction of upper extremity: Secondary | ICD-10-CM | POA: Diagnosis not present

## 2022-08-19 DIAGNOSIS — M5412 Radiculopathy, cervical region: Secondary | ICD-10-CM | POA: Diagnosis not present

## 2022-08-26 DIAGNOSIS — S46011A Strain of muscle(s) and tendon(s) of the rotator cuff of right shoulder, initial encounter: Secondary | ICD-10-CM | POA: Diagnosis not present

## 2022-08-26 DIAGNOSIS — M9901 Segmental and somatic dysfunction of cervical region: Secondary | ICD-10-CM | POA: Diagnosis not present

## 2022-08-26 DIAGNOSIS — M9902 Segmental and somatic dysfunction of thoracic region: Secondary | ICD-10-CM | POA: Diagnosis not present

## 2022-08-26 DIAGNOSIS — M5412 Radiculopathy, cervical region: Secondary | ICD-10-CM | POA: Diagnosis not present

## 2022-08-26 DIAGNOSIS — M9907 Segmental and somatic dysfunction of upper extremity: Secondary | ICD-10-CM | POA: Diagnosis not present

## 2022-08-26 DIAGNOSIS — M62830 Muscle spasm of back: Secondary | ICD-10-CM | POA: Diagnosis not present

## 2022-09-01 ENCOUNTER — Ambulatory Visit: Payer: BC Managed Care – PPO | Admitting: Internal Medicine

## 2022-09-09 DIAGNOSIS — M5412 Radiculopathy, cervical region: Secondary | ICD-10-CM | POA: Diagnosis not present

## 2022-09-09 DIAGNOSIS — M62830 Muscle spasm of back: Secondary | ICD-10-CM | POA: Diagnosis not present

## 2022-09-09 DIAGNOSIS — S46011A Strain of muscle(s) and tendon(s) of the rotator cuff of right shoulder, initial encounter: Secondary | ICD-10-CM | POA: Diagnosis not present

## 2022-09-09 DIAGNOSIS — M9902 Segmental and somatic dysfunction of thoracic region: Secondary | ICD-10-CM | POA: Diagnosis not present

## 2022-09-09 DIAGNOSIS — M9901 Segmental and somatic dysfunction of cervical region: Secondary | ICD-10-CM | POA: Diagnosis not present

## 2022-09-09 DIAGNOSIS — M9907 Segmental and somatic dysfunction of upper extremity: Secondary | ICD-10-CM | POA: Diagnosis not present

## 2022-09-19 ENCOUNTER — Ambulatory Visit
Admission: RE | Admit: 2022-09-19 | Discharge: 2022-09-19 | Disposition: A | Discharge status: Home / Self Care | Payer: BC Managed Care – PPO | Source: Ambulatory Visit | Attending: Urgent Care | Admitting: Urgent Care

## 2022-09-19 VITALS — BP 137/90 | HR 85 | Temp 97.7°F | Resp 17

## 2022-09-19 DIAGNOSIS — J209 Acute bronchitis, unspecified: Secondary | ICD-10-CM

## 2022-09-19 DIAGNOSIS — J22 Unspecified acute lower respiratory infection: Secondary | ICD-10-CM | POA: Diagnosis not present

## 2022-09-19 MED ORDER — BENZONATATE 100 MG PO CAPS: ORAL_CAPSULE | ORAL | 0 refills | Status: DC

## 2022-09-19 MED ORDER — PREDNISONE 20 MG PO TABS: ORAL_TABLET | ORAL | 0 refills | Status: AC

## 2022-09-19 MED ORDER — AZITHROMYCIN 250 MG PO TABS: 250.0000 mg | ORAL_TABLET | Freq: Every day | ORAL | 0 refills | Status: DC

## 2022-09-19 MED ORDER — HYDROCOD POLI-CHLORPHE POLI ER 10-8 MG/5ML PO SUER: 5.0000 mL | Freq: Two times a day (BID) | ORAL | 0 refills | Status: AC | PRN

## 2022-09-19 NOTE — Discharge Instructions (Signed)
Follow up here or with your primary care provider if your symptoms are worsening or not improving with treatment.     

## 2022-09-19 NOTE — ED Triage Notes (Signed)
Pt. Presents to UC w/ c/o a persistent cough for the past week.
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
## 2023-01-05 ENCOUNTER — Telehealth: Payer: Self-pay | Admitting: Internal Medicine

## 2023-01-05 ENCOUNTER — Other Ambulatory Visit: Payer: Self-pay | Admitting: *Deleted

## 2023-01-05 MED ORDER — AMLODIPINE BESYLATE 5 MG PO TABS: 5.0000 mg | ORAL_TABLET | Freq: Every day | ORAL | 1 refills | Status: DC

## 2023-01-05 NOTE — Telephone Encounter (Signed)
Refill sent.

## 2023-01-05 NOTE — Telephone Encounter (Signed)
Prescription Request  01/05/2023  LOV: 07/07/2022  What is the name of the medication or equipment? amLODipine (NORVASC) 5 MG tablet and losartan (COZAAR) 100 MG tablet  Have you contacted your pharmacy to request a refill? Yes   Which pharmacy would you like this sent to?   [id number] - [place], [place] - [address] [place] [address] Phone: [phone] Fax: [phone]    Patient notified that their request is being sent to the clinical staff for review and that they should receive a response within 2 business days.   Please advise at Mobile [phone] (mobile)

## 2023-02-05 ENCOUNTER — Other Ambulatory Visit: Payer: Self-pay | Admitting: Internal Medicine

## 2023-02-22 ENCOUNTER — Other Ambulatory Visit: Payer: Self-pay

## 2023-02-22 ENCOUNTER — Telehealth: Payer: Self-pay | Admitting: Internal Medicine

## 2023-02-22 MED ORDER — LOSARTAN POTASSIUM 100 MG PO TABS: 100.0000 mg | ORAL_TABLET | Freq: Every day | ORAL | 1 refills | Status: DC

## 2023-02-22 NOTE — Telephone Encounter (Signed)
Medication refilled

## 2023-02-22 NOTE — Telephone Encounter (Signed)
Prescription Request  02/22/2023  LOV: 07/07/2022  What is the name of the medication or equipment? losartan (COZAAR) 100 MG tablet. Patient is out of medication.   Have you contacted your pharmacy to request a refill? Yes   Which pharmacy would you like this sent to?  [id number] - [place], [place] - [address] AT [facility] [address] [facility] [address] [place] [address] Phone: [phone] Fax: [phone]    Patient notified that their request is being sent to the clinical staff for review and that they should receive a response within 2 business days.   Please advise at Mobile [phone] (mobile)

## 2023-03-02 ENCOUNTER — Encounter: Payer: Self-pay | Admitting: Internal Medicine

## 2023-03-02 ENCOUNTER — Ambulatory Visit: Payer: 59 | Admitting: Internal Medicine

## 2023-03-02 VITALS — BP 124/78 | HR 78 | Temp 98.0°F | Resp 16 | Ht 71.0 in | Wt 208.2 lb

## 2023-03-02 DIAGNOSIS — E7800 Pure hypercholesterolemia, unspecified: Secondary | ICD-10-CM

## 2023-03-02 DIAGNOSIS — R251 Tremor, unspecified: Secondary | ICD-10-CM

## 2023-03-02 DIAGNOSIS — R739 Hyperglycemia, unspecified: Secondary | ICD-10-CM

## 2023-03-02 DIAGNOSIS — I10 Essential (primary) hypertension: Secondary | ICD-10-CM | POA: Diagnosis not present

## 2023-03-02 DIAGNOSIS — F419 Anxiety disorder, unspecified: Secondary | ICD-10-CM

## 2023-03-02 DIAGNOSIS — K219 Gastro-esophageal reflux disease without esophagitis: Secondary | ICD-10-CM

## 2023-03-02 DIAGNOSIS — Z1211 Encounter for screening for malignant neoplasm of colon: Secondary | ICD-10-CM

## 2023-03-02 LAB — CBC WITH DIFFERENTIAL/PLATELET
Basophils Absolute: 0 10*3/uL (ref 0.0–0.1)
Basophils Relative: 0.5 % (ref 0.0–3.0)
Eosinophils Absolute: 0.1 10*3/uL (ref 0.0–0.7)
Eosinophils Relative: 1.7 % (ref 0.0–5.0)
HCT: 45.9 % (ref 39.0–52.0)
Hemoglobin: 15.7 g/dL (ref 13.0–17.0)
Lymphocytes Relative: 17.9 % (ref 12.0–46.0)
Lymphs Abs: 1 10*3/uL (ref 0.7–4.0)
MCHC: 34.2 g/dL (ref 30.0–36.0)
MCV: 91.6 fl (ref 78.0–100.0)
Monocytes Absolute: 0.5 10*3/uL (ref 0.1–1.0)
Monocytes Relative: 8.8 % (ref 3.0–12.0)
Neutro Abs: 4 10*3/uL (ref 1.4–7.7)
Neutrophils Relative %: 71.1 % (ref 43.0–77.0)
Platelets: 235 10*3/uL (ref 150.0–400.0)
RBC: 5.01 Mil/uL (ref 4.22–5.81)
RDW: 13.1 % (ref 11.5–15.5)
WBC: 5.7 10*3/uL (ref 4.0–10.5)

## 2023-03-02 LAB — BASIC METABOLIC PANEL
BUN: 9 mg/dL (ref 6–23)
CO2: 27 mEq/L (ref 19–32)
Calcium: 9.5 mg/dL (ref 8.4–10.5)
Chloride: 105 mEq/L (ref 96–112)
Creatinine, Ser: 0.99 mg/dL (ref 0.40–1.50)
GFR: 88.78 mL/min (ref >60.00)
Glucose, Bld: 100 mg/dL — ABNORMAL HIGH (ref 70–99)
Potassium: 4.3 mEq/L (ref 3.5–5.1)
Sodium: 141 mEq/L (ref 135–145)

## 2023-03-02 LAB — LIPID PANEL
Cholesterol: 130 mg/dL (ref 0–200)
HDL: 40.9 mg/dL (ref >39.00)
LDL Cholesterol: 69 mg/dL (ref 0–99)
NonHDL: 88.88
Total CHOL/HDL Ratio: 3
Triglycerides: 97 mg/dL (ref 0.0–149.0)
VLDL: 19.4 mg/dL (ref 0.0–40.0)

## 2023-03-02 LAB — TSH: TSH: 0.89 u[IU]/mL (ref 0.35–5.50)

## 2023-03-02 LAB — HEPATIC FUNCTION PANEL
ALT: 19 U/L (ref 0–53)
AST: 14 U/L (ref 0–37)
Albumin: 4.6 g/dL (ref 3.5–5.2)
Alkaline Phosphatase: 81 U/L (ref 39–117)
Bilirubin, Direct: 0.1 mg/dL (ref 0.0–0.3)
Total Bilirubin: 0.6 mg/dL (ref 0.2–1.2)
Total Protein: 6.7 g/dL (ref 6.0–8.3)

## 2023-03-02 LAB — VITAMIN B12: Vitamin B-12: >1500 pg/mL — ABNORMAL HIGH (ref 211–911)

## 2023-03-02 LAB — HEMOGLOBIN A1C: Hgb A1c MFr Bld: 5.6 % (ref 4.6–6.5)

## 2023-03-02 MED ORDER — AMLODIPINE BESYLATE 5 MG PO TABS: 5.0000 mg | ORAL_TABLET | Freq: Every day | ORAL | 1 refills | Status: DC

## 2023-03-02 NOTE — Progress Notes (Signed)
Subjective:    Patient ID: Fernando Harris, male    DOB: 07/21/1971, 52 y.o.   MRN: [id number]  Patient here for  Chief Complaint  Patient presents with   Medical Management of Chronic Issues    HPI Here to follow up regarding hypercholesterolemia and hypertension.  Last visit, losartan was increased to 100mg  q day.  Was having issues with right shoulder and arm pain.  Referred to ortho.  S/p steroid injection.  Seeing a Land.  Doing therapy.  Exercises.  Is better.  No chest pain or sob reported.  No abdominal pain or bowel change reported.  Does report noticing tremors.  States has noticed this for years, but may be a little worse..  notices more in am - after coffee.  Notices with writing or when doing fine motor task.  Has started drinking again.  May drink 3-4 beers 3 days per week.  Discussed decreasing amount drinking and stopping.    Past Medical History:  Diagnosis Date   Bulging disc    evaluated [facility]   GERD (gastroesophageal reflux disease)    Hypertension    Nephrolithiasis    kidney stones   Past Surgical History:  Procedure Laterality Date   COLONOSCOPY WITH PROPOFOL N/A 12/25/2020   Procedure: COLONOSCOPY WITH PROPOFOL;  Surgeon: Pasty Spillers, MD;  Location: [facility];  Service: Endoscopy;  Laterality: N/A;   ELBOW SURGERY     INGUINAL HERNIA REPAIR     TONSILLECTOMY     Family History  Problem Relation Age of Onset   Hypertension Father    Diabetes Paternal Grandmother    Heart Problems Paternal Grandmother    Social History   Socioeconomic History   Marital status: Married    Spouse name: Not on file   Number of children: Not on file   Years of education: Not on file   Highest education level: Not on file  Occupational History   Not on file  Tobacco Use   Smoking status: Former    Packs/day: 1.00    Years: 15.00    Additional pack years: 0.00    Total pack years: 15.00    Types: Cigarettes    Quit date:  04/18/2017    Years since quitting: 5.9   Smokeless tobacco: Former    Types: Snuff    Quit date: 08/21/2013  Substance and Sexual Activity   Alcohol use: Yes    Alcohol/week: 0.0 standard drinks of alcohol    Comment: OCCASSIONAL   Drug use: No   Sexual activity: Not on file  Other Topics Concern   Not on file  Social History Narrative   Married    Daughter    Social Determinants of Health   Financial Resource Strain: Not on file  Food Insecurity: Not on file  Transportation Needs: Not on file  Physical Activity: Not on file  Stress: Not on file  Social Connections: Not on file     Review of Systems  Constitutional:  Negative for appetite change and unexpected weight change.  HENT:  Negative for congestion and sinus pressure.   Respiratory:  Negative for cough, chest tightness and shortness of breath.   Cardiovascular:  Negative for chest pain, palpitations and leg swelling.  Gastrointestinal:  Negative for abdominal pain, diarrhea, nausea and vomiting.  Genitourinary:  Negative for difficulty urinating and dysuria.  Musculoskeletal:  Negative for joint swelling and myalgias.  Skin:  Negative for color change and rash.  Neurological:  Positive  for tremors. Negative for dizziness, weakness and headaches.  Psychiatric/Behavioral:  Negative for agitation and dysphoric mood.        Objective:     BP 124/78   Pulse 78   Temp 98 F (36.7 C)   Resp 16   Ht 5\' 11"  (1.803 m)   Wt 208 lb 3.2 oz (94.4 kg)   SpO2 98%   BMI 29.04 kg/m  Wt Readings from Last 3 Encounters:  03/02/23 208 lb 3.2 oz (94.4 kg)  07/06/22 215 lb 12 oz (97.9 kg)  04/10/22 218 lb 8 oz (99.1 kg)    Physical Exam Vitals reviewed.  Constitutional:      General: He is not in acute distress.    Appearance: Normal appearance. He is well-developed.  HENT:     Head: Normocephalic and atraumatic.     Right Ear: External ear normal.     Left Ear: External ear normal.  Eyes:     General: No  scleral icterus.       Right eye: No discharge.        Left eye: No discharge.     Conjunctiva/sclera: Conjunctivae normal.  Cardiovascular:     Rate and Rhythm: Normal rate and regular rhythm.  Pulmonary:     Effort: Pulmonary effort is normal. No respiratory distress.     Breath sounds: Normal breath sounds.  Abdominal:     General: Bowel sounds are normal.     Palpations: Abdomen is soft.     Tenderness: There is no abdominal tenderness.  Musculoskeletal:        General: No swelling or tenderness.     Cervical back: Neck supple. No tenderness.  Lymphadenopathy:     Cervical: No cervical adenopathy.  Skin:    Findings: No erythema or rash.  Neurological:     Mental Status: He is alert.     Comments: Mild tremor noted with hands extended.    Psychiatric:        Mood and Affect: Mood normal.        Behavior: Behavior normal.      Outpatient Encounter Medications as of 03/02/2023  Medication Sig   amLODipine (NORVASC) 5 MG tablet Take 1 tablet (5 mg total) by mouth daily.   aspirin EC 81 MG tablet Take 1 tablet (81 mg total) by mouth daily.   atorvastatin (LIPITOR) 20 MG tablet TAKE 1 TABLET EVERY DAY   cyanocobalamin (VITAMIN B12) 1000 MCG tablet Take 1 tablet by mouth daily.   lansoprazole (PREVACID) 15 MG capsule Take 15 mg by mouth daily at 12 noon.   losartan (COZAAR) 100 MG tablet Take 1 tablet (100 mg total) by mouth daily.   [DISCONTINUED] amLODipine (NORVASC) 5 MG tablet Take 1 tablet (5 mg total) by mouth daily.   [DISCONTINUED] Oxycodone HCl 10 MG TABS Take by mouth.   [DISCONTINUED] rosuvastatin (CRESTOR) 10 MG tablet Take by mouth.   No facility-administered encounter medications on file as of 03/02/2023.     Lab Results  Component Value Date   WBC 5.7 03/02/2023   HGB 15.7 03/02/2023   HCT 45.9 03/02/2023   PLT 235.0 03/02/2023   GLUCOSE 100 (H) 03/02/2023   CHOL 130 03/02/2023   TRIG 97.0 03/02/2023   HDL 40.90 03/02/2023   LDLDIRECT 95.0 02/28/2020    LDLCALC 69 03/02/2023   ALT 19 03/02/2023   AST 14 03/02/2023   NA 141 03/02/2023   K 4.3 03/02/2023   CL 105 03/02/2023  CREATININE 0.99 03/02/2023   BUN 9 03/02/2023   CO2 27 03/02/2023   TSH 0.89 03/02/2023   PSA 0.29 03/18/2022   HGBA1C 5.6 03/02/2023       Assessment & Plan:  Essential hypertension, benign Assessment & Plan: Blood pressure as outlined.  Continue losartan (100mg ) and amlodipine. Follow pressures.  Follow metabolic panel.   Orders: -     Basic metabolic panel  Hypercholesterolemia Assessment & Plan: Continue lipitor.  Low cholesterol diet and exercise.  Follow lipid panel and liver function tests.    Orders: -     Hepatic function panel -     Lipid panel -     CBC with Differential/Platelet  Hyperglycemia Assessment & Plan: Low carb diet and exercise.  Follow met b and a1c.  Lab Results  Component Value Date   HGBA1C 5.6 03/02/2023    Orders: -     Hemoglobin A1c  Tremor Assessment & Plan: Has noticed tremor as outlined. Discussed further w/up and evaluation.  He declines.  Wants to monitor.  Follow.    Orders: -     Vitamin B12 -     TSH  Anxiety Assessment & Plan: On no medication.  Stable.  Follow.    Colon cancer screening Assessment & Plan: Colonoscopy 12/25/2020 revealed hemorrhoids and diverticulosis.  Recommended follow-up in 10 years.   Gastroesophageal reflux disease, unspecified whether esophagitis present Assessment & Plan: No upper symptoms reported.  On prevacid.    Other orders -     amLODIPine Besylate; Take 1 tablet (5 mg total) by mouth daily.  Dispense: 90 tablet; Refill: 1     Dale [place], MD

## 2023-03-20 ENCOUNTER — Encounter: Payer: Self-pay | Admitting: Internal Medicine

## 2023-03-20 NOTE — Assessment & Plan Note (Signed)
Continue lipitor.  Low cholesterol diet and exercise.  Follow lipid panel and liver function tests.   

## 2023-03-20 NOTE — Assessment & Plan Note (Signed)
Blood pressure as outlined.  Continue losartan (100mg ) and amlodipine. Follow pressures.  Follow metabolic panel.

## 2023-03-20 NOTE — Assessment & Plan Note (Signed)
Colonoscopy 12/25/2020 revealed hemorrhoids and diverticulosis.  Recommended follow-up in 10 years. 

## 2023-03-20 NOTE — Assessment & Plan Note (Signed)
Low carb diet and exercise.  Follow met b and a1c.  Lab Results  Component Value Date   HGBA1C 5.6 03/02/2023

## 2023-03-20 NOTE — Assessment & Plan Note (Signed)
On no medication.  Stable.  Follow.    

## 2023-03-20 NOTE — Assessment & Plan Note (Signed)
Has noticed tremor as outlined. Discussed further w/up and evaluation.  He declines.  Wants to monitor.  Follow.

## 2023-03-20 NOTE — Assessment & Plan Note (Signed)
No upper symptoms reported.  On prevacid.  

## 2023-06-09 ENCOUNTER — Encounter (INDEPENDENT_AMBULATORY_CARE_PROVIDER_SITE_OTHER): Payer: Self-pay

## 2023-07-05 ENCOUNTER — Ambulatory Visit: Payer: 59 | Admitting: Internal Medicine

## 2023-07-06 ENCOUNTER — Other Ambulatory Visit: Payer: Self-pay | Admitting: Internal Medicine

## 2023-07-09 ENCOUNTER — Other Ambulatory Visit: Payer: Self-pay | Admitting: Internal Medicine

## 2023-08-13 ENCOUNTER — Other Ambulatory Visit: Payer: Self-pay | Admitting: Internal Medicine

## 2023-08-17 ENCOUNTER — Other Ambulatory Visit: Payer: Self-pay | Admitting: Internal Medicine

## 2023-08-17 ENCOUNTER — Encounter: Payer: Self-pay | Admitting: *Deleted

## 2023-08-17 ENCOUNTER — Telehealth: Payer: Self-pay | Admitting: Internal Medicine

## 2023-08-17 MED ORDER — LOSARTAN POTASSIUM 100 MG PO TABS: 100.0000 mg | ORAL_TABLET | Freq: Every day | ORAL | 0 refills | Status: DC

## 2023-08-17 NOTE — Telephone Encounter (Signed)
Medication filled.  

## 2023-08-17 NOTE — Telephone Encounter (Signed)
Patient called and would like a med refill on  losartan (COZAAR) 100 MG tablet  Appt was made for 10/30 .   If so [facility] [address]

## 2023-08-17 NOTE — Addendum Note (Signed)
Addended by: Kristie Cowman on: 08/17/2023 12:47 PM   Modules accepted: Orders

## 2023-08-25 ENCOUNTER — Encounter: Payer: Self-pay | Admitting: Internal Medicine

## 2023-08-25 ENCOUNTER — Ambulatory Visit: Payer: 59 | Admitting: Internal Medicine

## 2023-08-25 VITALS — BP 126/74 | HR 74 | Temp 98.2°F | Resp 16 | Ht 70.0 in | Wt 212.0 lb

## 2023-08-25 DIAGNOSIS — E7800 Pure hypercholesterolemia, unspecified: Secondary | ICD-10-CM

## 2023-08-25 DIAGNOSIS — K219 Gastro-esophageal reflux disease without esophagitis: Secondary | ICD-10-CM

## 2023-08-25 DIAGNOSIS — F419 Anxiety disorder, unspecified: Secondary | ICD-10-CM

## 2023-08-25 DIAGNOSIS — R739 Hyperglycemia, unspecified: Secondary | ICD-10-CM | POA: Diagnosis not present

## 2023-08-25 DIAGNOSIS — Z125 Encounter for screening for malignant neoplasm of prostate: Secondary | ICD-10-CM

## 2023-08-25 DIAGNOSIS — R251 Tremor, unspecified: Secondary | ICD-10-CM

## 2023-08-25 DIAGNOSIS — Z720 Tobacco use: Secondary | ICD-10-CM

## 2023-08-25 DIAGNOSIS — I10 Essential (primary) hypertension: Secondary | ICD-10-CM | POA: Diagnosis not present

## 2023-08-25 DIAGNOSIS — Z1211 Encounter for screening for malignant neoplasm of colon: Secondary | ICD-10-CM

## 2023-08-25 LAB — BASIC METABOLIC PANEL
BUN: 12 mg/dL (ref 6–23)
CO2: 27 meq/L (ref 19–32)
Calcium: 9.6 mg/dL (ref 8.4–10.5)
Chloride: 105 meq/L (ref 96–112)
Creatinine, Ser: 1 mg/dL (ref 0.40–1.50)
GFR: 87.42 mL/min (ref >60.00)
Glucose, Bld: 101 mg/dL — ABNORMAL HIGH (ref 70–99)
Potassium: 4.1 meq/L (ref 3.5–5.1)
Sodium: 141 meq/L (ref 135–145)

## 2023-08-25 LAB — LIPID PANEL
Cholesterol: 159 mg/dL (ref 0–200)
HDL: 36.9 mg/dL — ABNORMAL LOW (ref >39.00)
LDL Cholesterol: 87 mg/dL (ref 0–99)
NonHDL: 121.7
Total CHOL/HDL Ratio: 4
Triglycerides: 173 mg/dL — ABNORMAL HIGH (ref 0.0–149.0)
VLDL: 34.6 mg/dL (ref 0.0–40.0)

## 2023-08-25 LAB — HEPATIC FUNCTION PANEL
ALT: 20 U/L (ref 0–53)
AST: 14 U/L (ref 0–37)
Albumin: 4.8 g/dL (ref 3.5–5.2)
Alkaline Phosphatase: 77 U/L (ref 39–117)
Bilirubin, Direct: 0.2 mg/dL (ref 0.0–0.3)
Total Bilirubin: 1 mg/dL (ref 0.2–1.2)
Total Protein: 6.9 g/dL (ref 6.0–8.3)

## 2023-08-25 LAB — PSA: PSA: 0.38 ng/mL (ref 0.10–4.00)

## 2023-08-25 LAB — HEMOGLOBIN A1C: Hgb A1c MFr Bld: 5.8 % (ref 4.6–6.5)

## 2023-08-25 MED ORDER — ATORVASTATIN CALCIUM 20 MG PO TABS: 20.0000 mg | ORAL_TABLET | Freq: Every day | ORAL | 0 refills | Status: DC

## 2023-08-25 MED ORDER — AMLODIPINE BESYLATE 5 MG PO TABS: 5.0000 mg | ORAL_TABLET | Freq: Every day | ORAL | 1 refills | Status: DC

## 2023-08-25 MED ORDER — LOSARTAN POTASSIUM 100 MG PO TABS: 100.0000 mg | ORAL_TABLET | Freq: Every day | ORAL | 1 refills | Status: DC

## 2023-08-25 NOTE — Assessment & Plan Note (Signed)
Blood pressure as outlined.  Continue losartan (100mg ) and amlodipine. Follow pressures.  Follow metabolic panel.

## 2023-08-25 NOTE — Assessment & Plan Note (Signed)
Continue lipitor.  Low cholesterol diet and exercise.  Follow lipid panel and liver function tests.   

## 2023-08-25 NOTE — Assessment & Plan Note (Signed)
On no medication.  Stable.  Follow.

## 2023-08-25 NOTE — Assessment & Plan Note (Signed)
Colonoscopy 12/25/2020 revealed hemorrhoids and diverticulosis.  Recommended follow-up in 10 years. 

## 2023-08-25 NOTE — Assessment & Plan Note (Signed)
No upper symptoms reported.  On prevacid.  

## 2023-08-25 NOTE — Progress Notes (Signed)
Subjective:    Patient ID: Fernando Harris, male    DOB: Aug 05, 1971, 52 y.o.   MRN: [id number]  Patient here for  Chief Complaint  Patient presents with   Medical Management of Chronic Issues    HPI Here to follow up regarding hypercholesterolemia and hypertension. Continues on losartan and amlodipine. Last visit, had noticed some tremors. Also reported increased alcohol intake. He comes in today stating he is doing relatively well.  Has decreased his alcohol intake.  Doing much better with this.  Tremors - better.  No chest pain or sob reported.  No cough or congestion.  No abdominal pain or bowel change.     Past Medical History:  Diagnosis Date   Bulging disc    evaluated [facility]   GERD (gastroesophageal reflux disease)    Hypertension    Nephrolithiasis    kidney stones   Past Surgical History:  Procedure Laterality Date   COLONOSCOPY WITH PROPOFOL N/A 12/25/2020   Procedure: COLONOSCOPY WITH PROPOFOL;  Surgeon: Pasty Spillers, MD;  Location: [facility];  Service: Endoscopy;  Laterality: N/A;   ELBOW SURGERY     INGUINAL HERNIA REPAIR     TONSILLECTOMY     Family History  Problem Relation Age of Onset   Hypertension Father    Diabetes Paternal Grandmother    Heart Problems Paternal Grandmother    Social History   Socioeconomic History   Marital status: Married    Spouse name: Not on file   Number of children: Not on file   Years of education: Not on file   Highest education level: Not on file  Occupational History   Not on file  Tobacco Use   Smoking status: Former    Current packs/day: 0.00    Average packs/day: 1 pack/day for 15.0 years (15.0 ttl pk-yrs)    Types: Cigarettes    Start date: 04/18/2002    Quit date: 04/18/2017    Years since quitting: 6.3   Smokeless tobacco: Former    Types: Snuff    Quit date: 08/21/2013  Substance and Sexual Activity   Alcohol use: Yes    Alcohol/week: 0.0 standard drinks of alcohol    Comment:  OCCASSIONAL   Drug use: No   Sexual activity: Not on file  Other Topics Concern   Not on file  Social History Narrative   Married    Daughter    Social Determinants of Health   Financial Resource Strain: Not on file  Food Insecurity: Not on file  Transportation Needs: Not on file  Physical Activity: Not on file  Stress: Not on file  Social Connections: Not on file     Review of Systems  Constitutional:  Negative for appetite change and unexpected weight change.  HENT:  Negative for congestion and sinus pressure.   Respiratory:  Negative for cough, chest tightness and shortness of breath.   Cardiovascular:  Negative for chest pain, palpitations and leg swelling.  Gastrointestinal:  Negative for abdominal pain, diarrhea, nausea and vomiting.  Genitourinary:  Negative for difficulty urinating and dysuria.  Musculoskeletal:  Negative for joint swelling and myalgias.  Skin:  Negative for color change and rash.  Neurological:  Negative for dizziness and headaches.  Psychiatric/Behavioral:  Negative for agitation and dysphoric mood.        Objective:     BP 126/74   Pulse 74   Temp 98.2 F (36.8 C)   Resp 16   Ht 5\' 10"  (1.778 m)  Wt 212 lb (96.2 kg)   SpO2 98%   BMI 30.42 kg/m  Wt Readings from Last 3 Encounters:  08/25/23 212 lb (96.2 kg)  03/02/23 208 lb 3.2 oz (94.4 kg)  07/06/22 215 lb 12 oz (97.9 kg)    Physical Exam Constitutional:      General: He is not in acute distress.    Appearance: Normal appearance. He is well-developed.  HENT:     Head: Normocephalic and atraumatic.     Right Ear: External ear normal.     Left Ear: External ear normal.  Eyes:     General: No scleral icterus.       Right eye: No discharge.        Left eye: No discharge.  Cardiovascular:     Rate and Rhythm: Normal rate and regular rhythm.  Pulmonary:     Effort: Pulmonary effort is normal. No respiratory distress.     Breath sounds: Normal breath sounds.  Abdominal:      General: Bowel sounds are normal.     Palpations: Abdomen is soft.     Tenderness: There is no abdominal tenderness.  Musculoskeletal:        General: No swelling or tenderness.     Cervical back: Neck supple. No tenderness.  Lymphadenopathy:     Cervical: No cervical adenopathy.  Skin:    Findings: No erythema or rash.  Neurological:     Mental Status: He is alert.  Psychiatric:        Mood and Affect: Mood normal.        Behavior: Behavior normal.      Outpatient Encounter Medications as of 08/25/2023  Medication Sig   amLODipine (NORVASC) 5 MG tablet Take 1 tablet (5 mg total) by mouth daily.   aspirin EC 81 MG tablet Take 1 tablet (81 mg total) by mouth daily.   atorvastatin (LIPITOR) 20 MG tablet Take 1 tablet (20 mg total) by mouth daily.   cyanocobalamin (VITAMIN B12) 1000 MCG tablet Take 1 tablet by mouth daily.   lansoprazole (PREVACID) 15 MG capsule Take 15 mg by mouth daily at 12 noon.   losartan (COZAAR) 100 MG tablet Take 1 tablet (100 mg total) by mouth daily.   [DISCONTINUED] amLODipine (NORVASC) 5 MG tablet TAKE 1 TABLET(5 MG) BY MOUTH DAILY   [DISCONTINUED] atorvastatin (LIPITOR) 20 MG tablet TAKE 1 TABLET EVERY DAY   [DISCONTINUED] losartan (COZAAR) 100 MG tablet TAKE 1 TABLET(100 MG) BY MOUTH DAILY   No facility-administered encounter medications on file as of 08/25/2023.     Lab Results  Component Value Date   WBC 5.7 03/02/2023   HGB 15.7 03/02/2023   HCT 45.9 03/02/2023   PLT 235.0 03/02/2023   GLUCOSE 100 (H) 03/02/2023   CHOL 130 03/02/2023   TRIG 97.0 03/02/2023   HDL 40.90 03/02/2023   LDLDIRECT 95.0 02/28/2020   LDLCALC 69 03/02/2023   ALT 19 03/02/2023   AST 14 03/02/2023   NA 141 03/02/2023   K 4.3 03/02/2023   CL 105 03/02/2023   CREATININE 0.99 03/02/2023   BUN 9 03/02/2023   CO2 27 03/02/2023   TSH 0.89 03/02/2023   PSA 0.29 03/18/2022   HGBA1C 5.6 03/02/2023    No results found.     Assessment & Plan:   Hypercholesterolemia Assessment & Plan: Continue lipitor.  Low cholesterol diet and exercise.  Follow lipid panel and liver function tests.    Orders: -     Hepatic function panel -  Lipid panel  Hyperglycemia Assessment & Plan: Low carb diet and exercise.  Follow met b and a1c.  Lab Results  Component Value Date   HGBA1C 5.6 03/02/2023    Orders: -     Hemoglobin A1c  Essential hypertension, benign Assessment & Plan: Blood pressure as outlined.  Continue losartan (100mg ) and amlodipine. Follow pressures.  Follow metabolic panel.   Orders: -     Basic metabolic panel  Prostate cancer screening -     PSA  Tremor Assessment & Plan: previously noticed tremor as outlined. Better.  Follow.    Tobacco abuse Assessment & Plan: Not smoking.  Has stopped vaping.  Follow.    Gastroesophageal reflux disease, unspecified whether esophagitis present Assessment & Plan: No upper symptoms reported.  On prevacid.    Colon cancer screening Assessment & Plan: Colonoscopy 12/25/2020 revealed hemorrhoids and diverticulosis.  Recommended follow-up in 10 years.   Anxiety Assessment & Plan: On no medication.  Stable.  Follow.    Other orders -     amLODIPine Besylate; Take 1 tablet (5 mg total) by mouth daily.  Dispense: 90 tablet; Refill: 1 -     Atorvastatin Calcium; Take 1 tablet (20 mg total) by mouth daily.  Dispense: 90 tablet; Refill: 0 -     Losartan Potassium; Take 1 tablet (100 mg total) by mouth daily.  Dispense: 90 tablet; Refill: 1     Dale [place], MD

## 2023-08-25 NOTE — Assessment & Plan Note (Signed)
Not smoking.  Has stopped vaping.  Follow.

## 2023-08-25 NOTE — Assessment & Plan Note (Signed)
previously noticed tremor as outlined. Better.  Follow.

## 2023-08-25 NOTE — Assessment & Plan Note (Signed)
Low carb diet and exercise.  Follow met b and a1c.  Lab Results  Component Value Date   HGBA1C 5.6 03/02/2023

## 2023-09-14 ENCOUNTER — Other Ambulatory Visit: Payer: Self-pay | Admitting: Internal Medicine

## 2023-10-06 ENCOUNTER — Other Ambulatory Visit: Payer: Self-pay | Admitting: Internal Medicine

## 2024-01-03 ENCOUNTER — Other Ambulatory Visit: Payer: Self-pay | Admitting: Internal Medicine

## 2024-02-22 ENCOUNTER — Encounter: Payer: Self-pay | Admitting: Internal Medicine

## 2024-02-22 ENCOUNTER — Ambulatory Visit (INDEPENDENT_AMBULATORY_CARE_PROVIDER_SITE_OTHER): Payer: 59 | Admitting: Internal Medicine

## 2024-02-22 VITALS — BP 128/78 | HR 84 | Temp 98.0°F | Resp 16 | Ht 70.0 in | Wt 217.6 lb

## 2024-02-22 DIAGNOSIS — R739 Hyperglycemia, unspecified: Secondary | ICD-10-CM

## 2024-02-22 DIAGNOSIS — F1029 Alcohol dependence with unspecified alcohol-induced disorder: Secondary | ICD-10-CM | POA: Diagnosis not present

## 2024-02-22 DIAGNOSIS — R251 Tremor, unspecified: Secondary | ICD-10-CM | POA: Diagnosis not present

## 2024-02-22 DIAGNOSIS — Z0000 Encounter for general adult medical examination without abnormal findings: Secondary | ICD-10-CM | POA: Diagnosis not present

## 2024-02-22 DIAGNOSIS — Z720 Tobacco use: Secondary | ICD-10-CM

## 2024-02-22 DIAGNOSIS — I10 Essential (primary) hypertension: Secondary | ICD-10-CM | POA: Diagnosis not present

## 2024-02-22 DIAGNOSIS — H538 Other visual disturbances: Secondary | ICD-10-CM

## 2024-02-22 DIAGNOSIS — E7800 Pure hypercholesterolemia, unspecified: Secondary | ICD-10-CM | POA: Diagnosis not present

## 2024-02-22 DIAGNOSIS — F1020 Alcohol dependence, uncomplicated: Secondary | ICD-10-CM | POA: Insufficient documentation

## 2024-02-22 LAB — LIPID PANEL
Cholesterol: 152 mg/dL (ref 0–200)
HDL: 39.7 mg/dL (ref >39.00)
LDL Cholesterol: 76 mg/dL (ref 0–99)
NonHDL: 112.34
Total CHOL/HDL Ratio: 4
Triglycerides: 184 mg/dL — ABNORMAL HIGH (ref 0.0–149.0)
VLDL: 36.8 mg/dL (ref 0.0–40.0)

## 2024-02-22 LAB — HEPATIC FUNCTION PANEL
ALT: 20 U/L (ref 0–53)
AST: 14 U/L (ref 0–37)
Albumin: 4.9 g/dL (ref 3.5–5.2)
Alkaline Phosphatase: 79 U/L (ref 39–117)
Bilirubin, Direct: 0.1 mg/dL (ref 0.0–0.3)
Total Bilirubin: 0.8 mg/dL (ref 0.2–1.2)
Total Protein: 6.8 g/dL (ref 6.0–8.3)

## 2024-02-22 LAB — BASIC METABOLIC PANEL WITH GFR
BUN: 11 mg/dL (ref 6–23)
CO2: 28 meq/L (ref 19–32)
Calcium: 9.3 mg/dL (ref 8.4–10.5)
Chloride: 102 meq/L (ref 96–112)
Creatinine, Ser: 0.92 mg/dL (ref 0.40–1.50)
GFR: 96.29 mL/min (ref >60.00)
Glucose, Bld: 102 mg/dL — ABNORMAL HIGH (ref 70–99)
Potassium: 4.3 meq/L (ref 3.5–5.1)
Sodium: 139 meq/L (ref 135–145)

## 2024-02-22 LAB — CBC WITH DIFFERENTIAL/PLATELET
Basophils Absolute: 0 10*3/uL (ref 0.0–0.1)
Basophils Relative: 0.5 % (ref 0.0–3.0)
Eosinophils Absolute: 0.1 10*3/uL (ref 0.0–0.7)
Eosinophils Relative: 1.1 % (ref 0.0–5.0)
HCT: 45 % (ref 39.0–52.0)
Hemoglobin: 15.3 g/dL (ref 13.0–17.0)
Lymphocytes Relative: 17.5 % (ref 12.0–46.0)
Lymphs Abs: 1.1 10*3/uL (ref 0.7–4.0)
MCHC: 34.1 g/dL (ref 30.0–36.0)
MCV: 88.9 fl (ref 78.0–100.0)
Monocytes Absolute: 0.4 10*3/uL (ref 0.1–1.0)
Monocytes Relative: 7.2 % (ref 3.0–12.0)
Neutro Abs: 4.4 10*3/uL (ref 1.4–7.7)
Neutrophils Relative %: 73.7 % (ref 43.0–77.0)
Platelets: 255 10*3/uL (ref 150.0–400.0)
RBC: 5.06 Mil/uL (ref 4.22–5.81)
RDW: 13.3 % (ref 11.5–15.5)
WBC: 6 10*3/uL (ref 4.0–10.5)

## 2024-02-22 LAB — HEMOGLOBIN A1C: Hgb A1c MFr Bld: 5.7 % (ref 4.6–6.5)

## 2024-02-22 LAB — VITAMIN B12: Vitamin B-12: 913 pg/mL — ABNORMAL HIGH (ref 211–911)

## 2024-02-22 LAB — TSH: TSH: 0.64 u[IU]/mL (ref 0.35–5.50)

## 2024-02-22 MED ORDER — AMLODIPINE BESYLATE 5 MG PO TABS: 5.0000 mg | ORAL_TABLET | Freq: Every day | ORAL | 1 refills | Status: DC

## 2024-02-22 MED ORDER — ATORVASTATIN CALCIUM 20 MG PO TABS: 20.0000 mg | ORAL_TABLET | Freq: Every day | ORAL | 1 refills | Status: DC

## 2024-02-22 MED ORDER — LOSARTAN POTASSIUM 100 MG PO TABS: 100.0000 mg | ORAL_TABLET | Freq: Every day | ORAL | 1 refills | Status: DC

## 2024-02-22 NOTE — Assessment & Plan Note (Signed)
 Discussed the need to decrease alcohol intake. Information given regarding programs. Check B12 and thiamine.

## 2024-02-22 NOTE — Assessment & Plan Note (Signed)
 Low carb diet and exercise. Follow met b and A1c.  Lab Results  Component Value Date   HGBA1C 5.8 08/25/2023

## 2024-02-22 NOTE — Assessment & Plan Note (Signed)
 Physical today 02/22/24.  Colonoscopy 12/25/20 - hemorrhoid - f/u in 10 years.  PSA -08/25/23 - .38.

## 2024-02-22 NOTE — Assessment & Plan Note (Signed)
 Blood pressure as outlined.  Continue losartan  (100mg ) and amlodipine . Follow pressures.  Check metabolic panel today.

## 2024-02-22 NOTE — Assessment & Plan Note (Signed)
 No vision loss. No focal neurological changes. Declines further neurological testing. Referral to ophthalmology.

## 2024-02-22 NOTE — Assessment & Plan Note (Signed)
 Continue lipitor.  Low cholesterol diet and exercise.  Follow lipid panel and liver function tests.

## 2024-02-22 NOTE — Progress Notes (Signed)
 Subjective:    Patient ID: Fernando Harris, male    DOB: 14-May-1971, 53 y.o.   MRN: [id number]  Patient here for  Chief Complaint  Patient presents with   Annual Exam    HPI Here for a physical exam. Continues on losartan  and amlodipine . Reports some increased fatigue. No chest pain or sob reported. No increased cough or congestion. No abdominal pain currently. Did have two flares of left lower quadrant pain and some bowel change previously. Resolved and has not been an issue for several weeks now. Does report days of increased alcohol intake. Discussed the need to stop drinking. Discussed outpatient programs. Tremors - he only notices in am before eating. Desires no further testing. Does report occasional blurred vision. No vision loss. Has had his eyes check previously for this. Due now. Desires no further neurological testing.    Past Medical History:  Diagnosis Date   Bulging disc    evaluated [facility]   GERD (gastroesophageal reflux disease)    Hypertension    Nephrolithiasis    kidney stones   Past Surgical History:  Procedure Laterality Date   COLONOSCOPY WITH PROPOFOL  N/A 12/25/2020   Procedure: COLONOSCOPY WITH PROPOFOL ;  Surgeon: Irby Mannan, MD;  Location: [facility]COPY;  Service: Endoscopy;  Laterality: N/A;   ELBOW SURGERY     INGUINAL HERNIA REPAIR     TONSILLECTOMY     Family History  Problem Relation Age of Onset   Hypertension Father    Diabetes Paternal Grandmother    Heart Problems Paternal Grandmother    Social History   Socioeconomic History   Marital status: Married    Spouse name: Not on file   Number of children: Not on file   Years of education: Not on file   Highest education level: Not on file  Occupational History   Not on file  Tobacco Use   Smoking status: Former    Current packs/day: 0.00    Average packs/day: 1 pack/day for 15.0 years (15.0 ttl pk-yrs)    Types: Cigarettes    Start date: 04/18/2002    Quit date:  04/18/2017    Years since quitting: 6.8   Smokeless tobacco: Former    Types: Snuff    Quit date: 08/21/2013  Substance and Sexual Activity   Alcohol use: Yes    Alcohol/week: 0.0 standard drinks of alcohol    Comment: OCCASSIONAL   Drug use: No   Sexual activity: Not on file  Other Topics Concern   Not on file  Social History Narrative   Married    Daughter    Social Drivers of Corporate investment banker Strain: Not on file  Food Insecurity: Not on file  Transportation Needs: Not on file  Physical Activity: Not on file  Stress: Not on file  Social Connections: Not on file     Review of Systems  Constitutional:  Negative for appetite change and unexpected weight change.  HENT:  Negative for congestion and sinus pressure.   Respiratory:  Negative for cough, chest tightness and shortness of breath.   Cardiovascular:  Negative for chest pain, palpitations and leg swelling.  Gastrointestinal:  Negative for abdominal pain, diarrhea, nausea and vomiting.  Genitourinary:  Negative for difficulty urinating and dysuria.  Musculoskeletal:  Negative for joint swelling and myalgias.  Skin:  Negative for color change and rash.  Neurological:  Positive for tremors. Negative for dizziness and headaches.  Psychiatric/Behavioral:  Negative for agitation and dysphoric mood.  Objective:     BP 128/78   Pulse 84   Temp 98 F (36.7 C)   Resp 16   Ht 5\' 10"  (1.778 m)   Wt 217 lb 9.6 oz (98.7 kg)   SpO2 98%   BMI 31.22 kg/m  Wt Readings from Last 3 Encounters:  02/22/24 217 lb 9.6 oz (98.7 kg)  08/25/23 212 lb (96.2 kg)  03/02/23 208 lb 3.2 oz (94.4 kg)    Physical Exam Vitals reviewed.  Constitutional:      General: He is not in acute distress.    Appearance: Normal appearance. He is well-developed.  HENT:     Head: Normocephalic and atraumatic.     Right Ear: External ear normal.     Left Ear: External ear normal.     Mouth/Throat:     Pharynx: No oropharyngeal  exudate or posterior oropharyngeal erythema.  Eyes:     General: No scleral icterus.       Right eye: No discharge.        Left eye: No discharge.     Conjunctiva/sclera: Conjunctivae normal.  Cardiovascular:     Rate and Rhythm: Normal rate and regular rhythm.  Pulmonary:     Effort: Pulmonary effort is normal. No respiratory distress.     Breath sounds: Normal breath sounds.  Abdominal:     General: Bowel sounds are normal.     Palpations: Abdomen is soft.     Tenderness: There is no abdominal tenderness.  Musculoskeletal:        General: No swelling or tenderness.     Cervical back: Neck supple. No tenderness.  Lymphadenopathy:     Cervical: No cervical adenopathy.  Skin:    Findings: No erythema or rash.  Neurological:     Mental Status: He is alert.  Psychiatric:        Mood and Affect: Mood normal.        Behavior: Behavior normal.         Outpatient Encounter Medications as of 02/22/2024  Medication Sig   amLODipine  (NORVASC ) 5 MG tablet Take 1 tablet (5 mg total) by mouth daily.   aspirin  EC 81 MG tablet Take 1 tablet (81 mg total) by mouth daily.   atorvastatin  (LIPITOR) 20 MG tablet Take 1 tablet (20 mg total) by mouth daily.   cyanocobalamin  (VITAMIN B12) 1000 MCG tablet Take 1 tablet by mouth daily.   lansoprazole (PREVACID) 15 MG capsule Take 15 mg by mouth daily at 12 noon.   losartan  (COZAAR ) 100 MG tablet Take 1 tablet (100 mg total) by mouth daily.   [DISCONTINUED] amLODipine  (NORVASC ) 5 MG tablet Take 1 tablet (5 mg total) by mouth daily.   [DISCONTINUED] atorvastatin  (LIPITOR) 20 MG tablet TAKE 1 TABLET(20 MG) BY MOUTH DAILY   [DISCONTINUED] losartan  (COZAAR ) 100 MG tablet Take 1 tablet (100 mg total) by mouth daily.   No facility-administered encounter medications on file as of 02/22/2024.     Lab Results  Component Value Date   WBC 5.7 03/02/2023   HGB 15.7 03/02/2023   HCT 45.9 03/02/2023   PLT 235.0 03/02/2023   GLUCOSE 101 (H) 08/25/2023    CHOL 159 08/25/2023   TRIG 173.0 (H) 08/25/2023   HDL 36.90 (L) 08/25/2023   LDLDIRECT 95.0 02/28/2020   LDLCALC 87 08/25/2023   ALT 20 08/25/2023   AST 14 08/25/2023   NA 141 08/25/2023   K 4.1 08/25/2023   CL 105 08/25/2023   CREATININE 1.00 08/25/2023  BUN 12 08/25/2023   CO2 27 08/25/2023   TSH 0.89 03/02/2023   PSA 0.38 08/25/2023   HGBA1C 5.8 08/25/2023       Assessment & Plan:  Routine general medical examination at a health care facility  Hyperglycemia Assessment & Plan: Low carb diet and exercise. Follow met b and A1c.  Lab Results  Component Value Date   HGBA1C 5.8 08/25/2023     Orders: -     Hemoglobin A1c  Hypercholesterolemia Assessment & Plan: Continue lipitor.  Low cholesterol diet and exercise.  Follow lipid panel and liver function tests.   Orders: -     Lipid panel -     Hepatic function panel -     CBC with Differential/Platelet  Essential hypertension, benign Assessment & Plan: Blood pressure as outlined.  Continue losartan  (100mg ) and amlodipine . Follow pressures.  Check metabolic panel today.   Orders: -     Basic metabolic panel with GFR  Tremor Assessment & Plan: Tremor as outlined. Only notices in am before eating per his report. Discussed further testing and neurological evaluation. Declines. Wants to monitor.   Orders: -     TSH  Health care maintenance Assessment & Plan: Physical today 02/22/24.  Colonoscopy 12/25/20 - hemorrhoid - f/u in 10 years.  PSA -08/25/23 - .38.    Alcohol dependence with unspecified alcohol-induced disorder [facility]) Assessment & Plan: Discussed the need to decrease alcohol intake. Information given regarding programs. Check B12 and thiamine.   Orders: -     Vitamin B1 -     Vitamin B12  Tobacco abuse Assessment & Plan: Discussed the need to stop smoking. Only smokes when drinks alcohol.    Blurred vision Assessment & Plan: No vision loss. No focal neurological changes. Declines further  neurological testing. Referral to ophthalmology.   Orders: -     Ambulatory referral to Ophthalmology  Other orders -     amLODIPine  Besylate; Take 1 tablet (5 mg total) by mouth daily.  Dispense: 90 tablet; Refill: 1 -     Atorvastatin  Calcium ; Take 1 tablet (20 mg total) by mouth daily.  Dispense: 90 tablet; Refill: 1 -     Losartan  Potassium; Take 1 tablet (100 mg total) by mouth daily.  Dispense: 90 tablet; Refill: 1     Dellar Fenton, MD

## 2024-02-22 NOTE — Assessment & Plan Note (Signed)
 Discussed the need to stop smoking. Only smokes when drinks alcohol.

## 2024-02-22 NOTE — Assessment & Plan Note (Signed)
 Tremor as outlined. Only notices in am before eating per his report. Discussed further testing and neurological evaluation. Declines. Wants to monitor.

## 2024-02-27 LAB — VITAMIN B1: Vitamin B1 (Thiamine): 10 nmol/L (ref 8–30)

## 2024-06-11 ENCOUNTER — Telehealth: Payer: Self-pay | Admitting: Internal Medicine

## 2024-06-11 DIAGNOSIS — I10 Essential (primary) hypertension: Secondary | ICD-10-CM

## 2024-06-11 DIAGNOSIS — E7800 Pure hypercholesterolemia, unspecified: Secondary | ICD-10-CM

## 2024-06-11 DIAGNOSIS — R739 Hyperglycemia, unspecified: Secondary | ICD-10-CM

## 2024-06-11 NOTE — Telephone Encounter (Signed)
 Order placed for labs.

## 2024-06-11 NOTE — Telephone Encounter (Signed)
 Lab order needed

## 2024-06-11 NOTE — Addendum Note (Signed)
 Addended by: GLENDIA ALLENA RAMAN on: 06/11/2024 04:53 PM   Modules accepted: Orders

## 2024-06-20 ENCOUNTER — Other Ambulatory Visit (INDEPENDENT_AMBULATORY_CARE_PROVIDER_SITE_OTHER)

## 2024-06-20 DIAGNOSIS — E7800 Pure hypercholesterolemia, unspecified: Secondary | ICD-10-CM | POA: Diagnosis not present

## 2024-06-20 DIAGNOSIS — I10 Essential (primary) hypertension: Secondary | ICD-10-CM | POA: Diagnosis not present

## 2024-06-20 DIAGNOSIS — R739 Hyperglycemia, unspecified: Secondary | ICD-10-CM | POA: Diagnosis not present

## 2024-06-20 LAB — HEPATIC FUNCTION PANEL
ALT: 21 U/L (ref 0–53)
AST: 15 U/L (ref 0–37)
Albumin: 4.4 g/dL (ref 3.5–5.2)
Alkaline Phosphatase: 65 U/L (ref 39–117)
Bilirubin, Direct: 0.1 mg/dL (ref 0.0–0.3)
Total Bilirubin: 0.8 mg/dL (ref 0.2–1.2)
Total Protein: 6.7 g/dL (ref 6.0–8.3)

## 2024-06-20 LAB — HEMOGLOBIN A1C: Hgb A1c MFr Bld: 6.1 % (ref 4.6–6.5)

## 2024-06-20 LAB — BASIC METABOLIC PANEL WITH GFR
BUN: 13 mg/dL (ref 6–23)
CO2: 28 meq/L (ref 19–32)
Calcium: 9.1 mg/dL (ref 8.4–10.5)
Chloride: 103 meq/L (ref 96–112)
Creatinine, Ser: 0.94 mg/dL (ref 0.40–1.50)
GFR: 93.62 mL/min (ref >60.00)
Glucose, Bld: 109 mg/dL — ABNORMAL HIGH (ref 70–99)
Potassium: 4.1 meq/L (ref 3.5–5.1)
Sodium: 140 meq/L (ref 135–145)

## 2024-06-20 LAB — LIPID PANEL
Cholesterol: 113 mg/dL (ref 0–200)
HDL: 27.8 mg/dL — ABNORMAL LOW (ref >39.00)
LDL Cholesterol: 54 mg/dL (ref 0–99)
NonHDL: 84.99
Total CHOL/HDL Ratio: 4
Triglycerides: 153 mg/dL — ABNORMAL HIGH (ref 0.0–149.0)
VLDL: 30.6 mg/dL (ref 0.0–40.0)

## 2024-06-21 ENCOUNTER — Ambulatory Visit: Payer: Self-pay | Admitting: Internal Medicine

## 2024-06-22 ENCOUNTER — Encounter: Payer: Self-pay | Admitting: Internal Medicine

## 2024-06-22 ENCOUNTER — Ambulatory Visit: Admitting: Internal Medicine

## 2024-06-22 VITALS — BP 114/70 | HR 83 | Resp 16 | Ht 71.0 in | Wt 214.0 lb

## 2024-06-22 DIAGNOSIS — Z720 Tobacco use: Secondary | ICD-10-CM

## 2024-06-22 DIAGNOSIS — R739 Hyperglycemia, unspecified: Secondary | ICD-10-CM | POA: Diagnosis not present

## 2024-06-22 DIAGNOSIS — R251 Tremor, unspecified: Secondary | ICD-10-CM | POA: Diagnosis not present

## 2024-06-22 DIAGNOSIS — E7800 Pure hypercholesterolemia, unspecified: Secondary | ICD-10-CM

## 2024-06-22 DIAGNOSIS — Z1211 Encounter for screening for malignant neoplasm of colon: Secondary | ICD-10-CM

## 2024-06-22 DIAGNOSIS — I10 Essential (primary) hypertension: Secondary | ICD-10-CM

## 2024-06-22 DIAGNOSIS — F1029 Alcohol dependence with unspecified alcohol-induced disorder: Secondary | ICD-10-CM

## 2024-06-22 DIAGNOSIS — Q5561 Curvature of penis (lateral): Secondary | ICD-10-CM | POA: Insufficient documentation

## 2024-06-22 MED ORDER — ATORVASTATIN CALCIUM 20 MG PO TABS: 20.0000 mg | ORAL_TABLET | Freq: Every day | ORAL | 1 refills | Status: DC

## 2024-06-22 MED ORDER — AMLODIPINE BESYLATE 5 MG PO TABS: 5.0000 mg | ORAL_TABLET | Freq: Every day | ORAL | 1 refills | Status: DC

## 2024-06-22 MED ORDER — LOSARTAN POTASSIUM 100 MG PO TABS: 100.0000 mg | ORAL_TABLET | Freq: Every day | ORAL | 1 refills | Status: DC

## 2024-06-22 NOTE — Assessment & Plan Note (Signed)
 Tremor as outlined. Only notices in am before eating per his report. Discussed eating regular meals and not going long periods without eating. Has not been as significant of an issue.

## 2024-06-22 NOTE — Assessment & Plan Note (Signed)
 Discussed today. States only drinking 1-2x/week and no binge drinking. Continue to try and decrease amount of alcohol. Follow.

## 2024-06-22 NOTE — Assessment & Plan Note (Signed)
 Only notices when having an erection. Affecting intercourse. No other problems. No urinary issues. Request urology referral.

## 2024-06-22 NOTE — Assessment & Plan Note (Signed)
 Continue lipitor.  Low cholesterol diet and exercise.  Follow lipid panel and liver function tests. Discussed recent labs. HDL decreased. Exercise will increase. Continue to abstain from smoking. Follow lipid panel.  Lab Results  Component Value Date   CHOL 113 06/20/2024   HDL 27.80 (L) 06/20/2024   LDLCALC 54 06/20/2024   LDLDIRECT 95.0 02/28/2020   TRIG 153.0 (H) 06/20/2024   CHOLHDL 4 06/20/2024

## 2024-06-22 NOTE — Assessment & Plan Note (Signed)
 Has cut down smoking. Follow.

## 2024-06-22 NOTE — Assessment & Plan Note (Signed)
 Blood pressure as outlined. Continue losartan  and amlodipine . Follow pressures. Follow metbolic panel.

## 2024-06-22 NOTE — Assessment & Plan Note (Signed)
 Low carb diet and exercise discussed. A1c 6.1. follow met b and A1c.

## 2024-06-22 NOTE — Progress Notes (Signed)
 Subjective:    Patient ID: Fernando Harris, male    DOB: 1970-12-19, 53 y.o.   MRN: [id number]  Patient here for  Chief Complaint  Patient presents with   Medical Management of Chronic Issues    HPI Here for a scheduled follow up - follow up regarding hypertension, hypercholesterolemia and hyperglycemia. Discussed recent labs. A1c 6.1. discussed low HDL. Discussed diet and exercise. Also will still occasionally notice some tremors in the am - before he eats. Does not feel this is as bad. Discussed eating regular meals, adequate protein and not going long periods without eating. Breathing stable. No abdominal pain or bowel change reported. Does report he is concerned that he may have peyroni's. Noticed a curvature in his penis - when has an erection. No problems urinating. Is affecting - intercourse.    Past Medical History:  Diagnosis Date   Bulging disc    evaluated [facility]   GERD (gastroesophageal reflux disease)    Hypertension    Nephrolithiasis    kidney stones   Past Surgical History:  Procedure Laterality Date   COLONOSCOPY WITH PROPOFOL  N/A 12/25/2020   Procedure: COLONOSCOPY WITH PROPOFOL ;  Surgeon: Janalyn Keene NOVAK, MD;  Location: [facility] ENDOSCOPY;  Service: Endoscopy;  Laterality: N/A;   ELBOW SURGERY     INGUINAL HERNIA REPAIR     TONSILLECTOMY     Family History  Problem Relation Age of Onset   Hypertension Father    Diabetes Paternal Grandmother    Heart Problems Paternal Grandmother    Social History   Socioeconomic History   Marital status: Married    Spouse name: Not on file   Number of children: Not on file   Years of education: Not on file   Highest education level: Not on file  Occupational History   Not on file  Tobacco Use   Smoking status: Former    Current packs/day: 0.00    Average packs/day: 1 pack/day for 15.0 years (15.0 ttl pk-yrs)    Types: Cigarettes    Start date: 04/18/2002    Quit date: 04/18/2017    Years since  quitting: 7.1   Smokeless tobacco: Former    Types: Snuff    Quit date: 08/21/2013  Substance and Sexual Activity   Alcohol use: Yes    Alcohol/week: 0.0 standard drinks of alcohol    Comment: OCCASSIONAL   Drug use: No   Sexual activity: Not on file  Other Topics Concern   Not on file  Social History Narrative   Married    Daughter    Social Drivers of Corporate investment banker Strain: Not on file  Food Insecurity: Not on file  Transportation Needs: Not on file  Physical Activity: Not on file  Stress: Not on file  Social Connections: Not on file     Review of Systems  Constitutional:  Negative for appetite change and unexpected weight change.  HENT:  Negative for congestion and sinus pressure.   Respiratory:  Negative for cough, chest tightness and shortness of breath.   Cardiovascular:  Negative for chest pain, palpitations and leg swelling.  Gastrointestinal:  Negative for abdominal pain, diarrhea, nausea and vomiting.  Genitourinary:  Negative for difficulty urinating and dysuria.  Musculoskeletal:  Negative for joint swelling and myalgias.  Skin:  Negative for color change and rash.  Neurological:  Negative for dizziness and headaches.  Psychiatric/Behavioral:  Negative for agitation and dysphoric mood.        Objective:  BP 114/70   Pulse 83   Resp 16   Ht 5' 11 (1.803 m)   Wt 214 lb (97.1 kg)   SpO2 98%   BMI 29.85 kg/m  Wt Readings from Last 3 Encounters:  06/22/24 214 lb (97.1 kg)  02/22/24 217 lb 9.6 oz (98.7 kg)  08/25/23 212 lb (96.2 kg)    Physical Exam Vitals reviewed.  Constitutional:      General: He is not in acute distress.    Appearance: Normal appearance. He is well-developed.  HENT:     Head: Normocephalic and atraumatic.     Right Ear: External ear normal.     Left Ear: External ear normal.     Mouth/Throat:     Pharynx: No oropharyngeal exudate or posterior oropharyngeal erythema.  Eyes:     General: No scleral  icterus.       Right eye: No discharge.        Left eye: No discharge.     Conjunctiva/sclera: Conjunctivae normal.  Cardiovascular:     Rate and Rhythm: Normal rate and regular rhythm.  Pulmonary:     Effort: Pulmonary effort is normal. No respiratory distress.     Breath sounds: Normal breath sounds.  Abdominal:     General: Bowel sounds are normal.     Palpations: Abdomen is soft.     Tenderness: There is no abdominal tenderness.  Musculoskeletal:        General: No swelling or tenderness.     Cervical back: Neck supple. No tenderness.  Lymphadenopathy:     Cervical: No cervical adenopathy.  Skin:    Findings: No erythema or rash.  Neurological:     Mental Status: He is alert.  Psychiatric:        Mood and Affect: Mood normal.        Behavior: Behavior normal.         Outpatient Encounter Medications as of 06/22/2024  Medication Sig   aspirin  EC 81 MG tablet Take 1 tablet (81 mg total) by mouth daily.   cyanocobalamin  (VITAMIN B12) 1000 MCG tablet Take 1 tablet by mouth daily.   lansoprazole (PREVACID) 15 MG capsule Take 15 mg by mouth daily at 12 noon.   amLODipine  (NORVASC ) 5 MG tablet Take 1 tablet (5 mg total) by mouth daily.   atorvastatin  (LIPITOR) 20 MG tablet Take 1 tablet (20 mg total) by mouth daily.   losartan  (COZAAR ) 100 MG tablet Take 1 tablet (100 mg total) by mouth daily.   [DISCONTINUED] amLODipine  (NORVASC ) 5 MG tablet Take 1 tablet (5 mg total) by mouth daily.   [DISCONTINUED] atorvastatin  (LIPITOR) 20 MG tablet Take 1 tablet (20 mg total) by mouth daily.   [DISCONTINUED] losartan  (COZAAR ) 100 MG tablet Take 1 tablet (100 mg total) by mouth daily.   No facility-administered encounter medications on file as of 06/22/2024.     Lab Results  Component Value Date   WBC 6.0 02/22/2024   HGB 15.3 02/22/2024   HCT 45.0 02/22/2024   PLT 255.0 02/22/2024   GLUCOSE 109 (H) 06/20/2024   CHOL 113 06/20/2024   TRIG 153.0 (H) 06/20/2024   HDL 27.80 (L)  06/20/2024   LDLDIRECT 95.0 02/28/2020   LDLCALC 54 06/20/2024   ALT 21 06/20/2024   AST 15 06/20/2024   NA 140 06/20/2024   K 4.1 06/20/2024   CL 103 06/20/2024   CREATININE 0.94 06/20/2024   BUN 13 06/20/2024   CO2 28 06/20/2024   TSH 0.64  02/22/2024   PSA 0.38 08/25/2023   HGBA1C 6.1 06/20/2024       Assessment & Plan:  Essential hypertension, benign Assessment & Plan: Blood pressure as outlined. Continue losartan  and amlodipine . Follow pressures. Follow metbolic panel.   Orders: -     Basic metabolic panel with GFR; Future  Hyperglycemia Assessment & Plan: Low carb diet and exercise discussed. A1c 6.1. follow met b and A1c.   Orders: -     Hemoglobin A1c; Future  Hypercholesterolemia Assessment & Plan: Continue lipitor.  Low cholesterol diet and exercise.  Follow lipid panel and liver function tests. Discussed recent labs. HDL decreased. Exercise will increase. Continue to abstain from smoking. Follow lipid panel.  Lab Results  Component Value Date   CHOL 113 06/20/2024   HDL 27.80 (L) 06/20/2024   LDLCALC 54 06/20/2024   LDLDIRECT 95.0 02/28/2020   TRIG 153.0 (H) 06/20/2024   CHOLHDL 4 06/20/2024     Orders: -     Lipid panel; Future -     Hepatic function panel; Future  Tremor Assessment & Plan: Tremor as outlined. Only notices in am before eating per his report. Discussed eating regular meals and not going long periods without eating. Has not been as significant of an issue.    Tobacco abuse Assessment & Plan: Has cut down smoking. Follow.    Colon cancer screening Assessment & Plan: Colonoscopy 12/25/2020 revealed hemorrhoids and diverticulosis.  Recommended follow-up in 10 years.   Alcohol dependence with unspecified alcohol-induced disorder [facility]) Assessment & Plan: Discussed today. States only drinking 1-2x/week and no binge drinking. Continue to try and decrease amount of alcohol. Follow.    Curvature of the penis Assessment & Plan: Only  notices when having an erection. Affecting intercourse. No other problems. No urinary issues. Request urology referral.   Orders: -     Ambulatory referral to Urology  Other orders -     amLODIPine  Besylate; Take 1 tablet (5 mg total) by mouth daily.  Dispense: 90 tablet; Refill: 1 -     Atorvastatin  Calcium ; Take 1 tablet (20 mg total) by mouth daily.  Dispense: 90 tablet; Refill: 1 -     Losartan  Potassium; Take 1 tablet (100 mg total) by mouth daily.  Dispense: 90 tablet; Refill: 1     Allena Hamilton, MD

## 2024-06-22 NOTE — Assessment & Plan Note (Signed)
Colonoscopy 12/25/2020 revealed hemorrhoids and diverticulosis.  Recommended follow-up in 10 years. 

## 2024-07-05 NOTE — Progress Notes (Signed)
   07/16/24 8:29 AM   Fernando Harris [birth year] [id number]  CC: penile curvature   HPI: 53 year old male here for initial evaluation of penile curvature  Onset: 6-8 months ago   Stable ~6 months Deformity: dorsal curvature (30-45% dosral, mid shaft POM[facility]) Erectile dysfunction: No   SHIM: 25 Sexual bother / QOL: mild to moderate bother Married, wife expresses mild to minimal bother  Denies any history of prior penile trauma, Dupuytren's contracture, connective tissue disorder  Prior treatments / therapies: None   History of hypertension, hyperlipidemia   PMH: Past Medical History:  Diagnosis Date   Bulging disc    evaluated [facility]   GERD (gastroesophageal reflux disease)    Hypertension    Nephrolithiasis    kidney stones    Surgical History: Past Surgical History:  Procedure Laterality Date   COLONOSCOPY WITH PROPOFOL  N/A 12/25/2020   Procedure: COLONOSCOPY WITH PROPOFOL ;  Surgeon: Janalyn Keene NOVAK, MD;  Location: [facility] ENDOSCOPY;  Service: Endoscopy;  Laterality: N/A;   ELBOW SURGERY     INGUINAL HERNIA REPAIR     TONSILLECTOMY      Family History: Family History  Problem Relation Age of Onset   Hypertension Father    Diabetes Paternal Grandmother    Heart Problems Paternal Grandmother     Social History:  reports that he quit smoking about 7 years ago. His smoking use included cigarettes. He started smoking about 22 years ago. He has a 15 pack-year smoking history. He quit smokeless tobacco use about 10 years ago.  His smokeless tobacco use included snuff. He reports current alcohol use. He reports that he does not use drugs.      Physical Exam: BP 133/89   Pulse 80   Ht 5' 10 (1.778 m)   Wt 210 lb (95.3 kg)   BMI 30.13 kg/m    Constitutional:  Alert and oriented, No acute distress. Cardiovascular: No clubbing, cyanosis, or edema. Respiratory: Normal respiratory effort, no increased work of breathing. GI: Nondistended GU:  Circumcised penis, possible palpable dorsal plaque at mid to base of shaft Skin: No rashes, bruises or suspicious lesions. Neurologic: Grossly intact, no focal deficits, moving all 4 extremities. Psychiatric: Normal mood and affect.  Laboratory Data: N/A   Pertinent Imaging: I have personally viewed and interpreted the N/A.    Assessment & Plan:    Peyronie disease Assessment & Plan: Peyronie's disease was discussed, including its variable natural history--some men stabilize or improve modestly with conservative measures, though many remain unchanged. Management is highly driven by patient preference, degree of deformity, degree of bother and patient's goals. We reviewed medical and traction-based therapies, which may reduce curvature or pain but have limited efficacy once stable. Office-based Xiaflex injections are suitable in select cases. Surgical options were outlined broadly (plication, grafting, or prosthesis depending on erectile function and deformity severity).   - He was not interested in surgical intervention at this time - I did offer some information for [employer], a commerical traction device retailer.  - Recommend referral to Dr. Lovie if he he desires 2nd opinion or evaluation for surgical repair- he elected to defer at this time  - If he schedules referral-recommended that he bring multiple photos of erection and curvature to next visit       Penne Skye, MD 07/16/2024  [facility] [address] [place], [place] [address] [phone]

## 2024-07-16 ENCOUNTER — Ambulatory Visit: Admitting: Urology

## 2024-07-16 ENCOUNTER — Encounter: Payer: Self-pay | Admitting: Urology

## 2024-07-16 VITALS — BP 133/89 | HR 80 | Ht 70.0 in | Wt 210.0 lb

## 2024-07-16 DIAGNOSIS — N486 Induration penis plastica: Secondary | ICD-10-CM | POA: Diagnosis not present

## 2024-07-16 NOTE — Patient Instructions (Signed)
 [email or web address]  Peyronie Disease - Traction device website

## 2024-07-16 NOTE — Assessment & Plan Note (Addendum)
 Peyronie's disease was discussed, including its variable natural history--some men stabilize or improve modestly with conservative measures, though many remain unchanged. Management is highly driven by patient preference, degree of deformity, degree of bother and patient's goals. We reviewed medical and traction-based therapies, which may reduce curvature or pain but have limited efficacy once stable. Office-based Xiaflex injections are suitable in select cases. Surgical options were outlined broadly (plication, grafting, or prosthesis depending on erectile function and deformity severity).   - He was not interested in surgical intervention at this time - I did offer some information for [employer], a commerical traction device retailer.  - Recommend referral to Dr. Lovie if he he desires 2nd opinion or evaluation for surgical repair- he elected to defer at this time  - If he schedules referral-recommended that he bring multiple photos of erection and curvature to next visit

## 2024-08-23 ENCOUNTER — Other Ambulatory Visit: Payer: Self-pay | Admitting: Internal Medicine

## 2024-09-16 ENCOUNTER — Ambulatory Visit
Admission: EM | Admit: 2024-09-16 | Discharge: 2024-09-16 | Disposition: A | Discharge status: Home / Self Care | Attending: Physician Assistant | Admitting: Physician Assistant

## 2024-09-16 ENCOUNTER — Encounter: Payer: Self-pay | Admitting: Emergency Medicine

## 2024-09-16 DIAGNOSIS — R051 Acute cough: Secondary | ICD-10-CM

## 2024-09-16 DIAGNOSIS — J069 Acute upper respiratory infection, unspecified: Secondary | ICD-10-CM | POA: Diagnosis not present

## 2024-09-16 LAB — POC COVID19/FLU A&B COMBO
Covid Antigen, POC: NEGATIVE
Influenza A Antigen, POC: NEGATIVE
Influenza B Antigen, POC: NEGATIVE

## 2024-09-16 MED ORDER — PROMETHAZINE-DM 6.25-15 MG/5ML PO SYRP: 5.0000 mL | ORAL_SOLUTION | Freq: Four times a day (QID) | ORAL | 0 refills | Status: DC | PRN

## 2024-09-16 MED ORDER — BENZONATATE 200 MG PO CAPS: 200.0000 mg | ORAL_CAPSULE | Freq: Three times a day (TID) | ORAL | 0 refills | Status: DC | PRN

## 2024-09-16 NOTE — ED Triage Notes (Signed)
 Patient c/o cough and chest congestion that started on Friday. Patient has been taking Robitussin at home.  Patient unsure of fevers.
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
## 2024-09-16 NOTE — Discharge Instructions (Signed)
-  Negative COVID and flu.  URI/COLD SYMPTOMS: Your exam today is consistent with a viral illness. Antibiotics are not indicated at this time. Use medications as directed, including cough syrup, nasal saline, and decongestants. Your symptoms should improve over the next few days and resolve within 7-10 days. Increase rest and fluids. F/u if symptoms worsen or predominate such as sore throat, ear pain, productive cough, shortness of breath, or if you develop high fevers or worsening fatigue over the next several days.   

## 2024-10-31 ENCOUNTER — Other Ambulatory Visit (INDEPENDENT_AMBULATORY_CARE_PROVIDER_SITE_OTHER)

## 2024-10-31 DIAGNOSIS — E7800 Pure hypercholesterolemia, unspecified: Secondary | ICD-10-CM | POA: Diagnosis not present

## 2024-10-31 DIAGNOSIS — R739 Hyperglycemia, unspecified: Secondary | ICD-10-CM

## 2024-10-31 DIAGNOSIS — I10 Essential (primary) hypertension: Secondary | ICD-10-CM

## 2024-10-31 LAB — HEMOGLOBIN A1C: Hgb A1c MFr Bld: 5.7 % (ref 4.6–6.5)

## 2024-10-31 LAB — LIPID PANEL
Cholesterol: 135 mg/dL (ref 28–200)
HDL: 33.8 mg/dL — ABNORMAL LOW
LDL Cholesterol: 61 mg/dL (ref 10–99)
NonHDL: 101.31
Total CHOL/HDL Ratio: 4
Triglycerides: 201 mg/dL — ABNORMAL HIGH (ref 10.0–149.0)
VLDL: 40.2 mg/dL — ABNORMAL HIGH (ref 0.0–40.0)

## 2024-10-31 LAB — HEPATIC FUNCTION PANEL
ALT: 25 U/L (ref 3–53)
AST: 19 U/L (ref 5–37)
Albumin: 4.4 g/dL (ref 3.5–5.2)
Alkaline Phosphatase: 70 U/L (ref 39–117)
Bilirubin, Direct: 0.1 mg/dL (ref 0.1–0.3)
Total Bilirubin: 0.7 mg/dL (ref 0.2–1.2)
Total Protein: 6.2 g/dL (ref 6.0–8.3)

## 2024-10-31 LAB — BASIC METABOLIC PANEL WITH GFR
BUN: 14 mg/dL (ref 6–23)
CO2: 30 meq/L (ref 19–32)
Calcium: 9 mg/dL (ref 8.4–10.5)
Chloride: 104 meq/L (ref 96–112)
Creatinine, Ser: 0.99 mg/dL (ref 0.40–1.50)
GFR: 87.75 mL/min
Glucose, Bld: 107 mg/dL — ABNORMAL HIGH (ref 70–99)
Potassium: 4.4 meq/L (ref 3.5–5.1)
Sodium: 138 meq/L (ref 135–145)

## 2024-11-01 ENCOUNTER — Ambulatory Visit: Payer: Self-pay | Admitting: Internal Medicine

## 2024-11-02 ENCOUNTER — Ambulatory Visit: Admitting: Internal Medicine

## 2024-11-02 ENCOUNTER — Encounter: Payer: Self-pay | Admitting: Internal Medicine

## 2024-11-02 VITALS — BP 136/78 | HR 80 | Temp 98.4°F | Ht 70.0 in | Wt 215.0 lb

## 2024-11-02 DIAGNOSIS — K219 Gastro-esophageal reflux disease without esophagitis: Secondary | ICD-10-CM

## 2024-11-02 DIAGNOSIS — H538 Other visual disturbances: Secondary | ICD-10-CM | POA: Diagnosis not present

## 2024-11-02 DIAGNOSIS — F419 Anxiety disorder, unspecified: Secondary | ICD-10-CM | POA: Diagnosis not present

## 2024-11-02 DIAGNOSIS — I10 Essential (primary) hypertension: Secondary | ICD-10-CM | POA: Diagnosis not present

## 2024-11-02 DIAGNOSIS — E7800 Pure hypercholesterolemia, unspecified: Secondary | ICD-10-CM

## 2024-11-02 DIAGNOSIS — R739 Hyperglycemia, unspecified: Secondary | ICD-10-CM

## 2024-11-02 MED ORDER — LOSARTAN POTASSIUM 100 MG PO TABS: 100.0000 mg | ORAL_TABLET | Freq: Every day | ORAL | 1 refills | Status: AC

## 2024-11-02 MED ORDER — AMLODIPINE BESYLATE 5 MG PO TABS: 5.0000 mg | ORAL_TABLET | Freq: Every day | ORAL | 1 refills | Status: AC

## 2024-11-02 MED ORDER — ATORVASTATIN CALCIUM 20 MG PO TABS: 20.0000 mg | ORAL_TABLET | Freq: Every day | ORAL | 1 refills | Status: AC

## 2024-11-02 NOTE — Progress Notes (Signed)
 "  Subjective:    Patient ID: Fernando Harris, male    DOB: [birth year], 54 y.o.   MRN: [id number]  Patient here for  Chief Complaint  Patient presents with   Medical Management of Chronic Issues    HPI Here for a scheduled follow up - follow up regarding hypertension, hypercholesterolemia and hyperglycemia. Recently evaluated - UC - cough and congestion. Diagnosed - viral illness - treated with promethazine  DM and tessalon  perles. Negative flu and negative covid. He is doing better now. Symptoms have resolved. No chest pain or sob. Discussed labs. Discussed elevated triglycerides. Discussed diet and exercise. He does report he recently decreased his blood pressure medication in 1/2. Felt like it was contributing to some blurry vision. Saw Eye MD. Wears readers. Gave him a new prescription. He feels his blood pressure has improved since he changed the medication. No vision loss. No pain in the eyes. No headache or dizziness.    Past Medical History:  Diagnosis Date   Bulging disc    evaluated [facility]   GERD (gastroesophageal reflux disease)    Hypertension    Nephrolithiasis    kidney stones   Past Surgical History:  Procedure Laterality Date   COLONOSCOPY WITH PROPOFOL  N/A 12/25/2020   Procedure: COLONOSCOPY WITH PROPOFOL ;  Surgeon: Janalyn Keene NOVAK, MD;  Location: [facility];  Service: Endoscopy;  Laterality: N/A;   ELBOW SURGERY     INGUINAL HERNIA REPAIR     TONSILLECTOMY     Family History  Problem Relation Age of Onset   Hypertension Father    Diabetes Paternal Grandmother    Heart Problems Paternal Grandmother    Social History   Socioeconomic History   Marital status: Married    Spouse name: Not on file   Number of children: Not on file   Years of education: Not on file   Highest education level: Not on file  Occupational History   Not on file  Tobacco Use   Smoking status: Former    Current packs/day: 0.00    Average packs/day: 1 pack/day for  15.0 years (15.0 ttl pk-yrs)    Types: Cigarettes    Start date: 04/18/2002    Quit date: 04/18/2017    Years since quitting: 7.5   Smokeless tobacco: Former    Types: Snuff    Quit date: 08/21/2013  Vaping Use   Vaping status: Never Used  Substance and Sexual Activity   Alcohol use: Yes    Alcohol/week: 0.0 standard drinks of alcohol    Comment: OCCASSIONAL   Drug use: No   Sexual activity: Not on file  Other Topics Concern   Not on file  Social History Narrative   Married    Daughter    Social Drivers of Health   Tobacco Use: Medium Risk (11/10/2024)   Patient History    Smoking Tobacco Use: Former    Smokeless Tobacco Use: Former    Passive Exposure: Not on Stage Manager: Not on file  Food Insecurity: Not on file  Transportation Needs: Not on file  Physical Activity: Not on file  Stress: Not on file  Social Connections: Not on file  Depression (PHQ2-9): Low Risk (06/22/2024)   Depression (PHQ2-9)    PHQ-2 Score: 0  Alcohol Screen: Not on file  Housing: Not on file  Utilities: Not on file  Health Literacy: Not on file     Review of Systems  Constitutional:  Negative for appetite change and unexpected weight  change.  HENT:  Negative for congestion and sinus pressure.   Respiratory:  Negative for cough, chest tightness and shortness of breath.   Cardiovascular:  Negative for chest pain, palpitations and leg swelling.  Gastrointestinal:  Negative for abdominal pain, diarrhea, nausea and vomiting.  Genitourinary:  Negative for difficulty urinating and dysuria.  Musculoskeletal:  Negative for joint swelling and myalgias.  Skin:  Negative for color change and rash.  Neurological:  Negative for dizziness and headaches.  Psychiatric/Behavioral:  Negative for agitation and dysphoric mood.        Objective:     BP 136/78   Pulse 80   Temp 98.4 F (36.9 C) (Oral)   Ht 5' 10 (1.778 m)   Wt 215 lb (97.5 kg)   SpO2 97%   BMI 30.85 kg/m  Wt  Readings from Last 3 Encounters:  11/02/24 215 lb (97.5 kg)  09/16/24 211 lb 3.2 oz (95.8 kg)  07/16/24 210 lb (95.3 kg)    Physical Exam Vitals reviewed.  Constitutional:      General: He is not in acute distress.    Appearance: Normal appearance. He is well-developed.  HENT:     Head: Normocephalic and atraumatic.     Right Ear: External ear normal.     Left Ear: External ear normal.  Eyes:     General: No scleral icterus.       Right eye: No discharge.        Left eye: No discharge.     Conjunctiva/sclera: Conjunctivae normal.  Cardiovascular:     Rate and Rhythm: Normal rate and regular rhythm.  Pulmonary:     Effort: Pulmonary effort is normal. No respiratory distress.     Breath sounds: Normal breath sounds.  Abdominal:     General: Bowel sounds are normal.     Palpations: Abdomen is soft.     Tenderness: There is no abdominal tenderness.  Musculoskeletal:        General: No swelling or tenderness.     Cervical back: Neck supple. No tenderness.  Lymphadenopathy:     Cervical: No cervical adenopathy.  Skin:    Findings: No erythema or rash.  Neurological:     Mental Status: He is alert.  Psychiatric:        Mood and Affect: Mood normal.        Behavior: Behavior normal.         Outpatient Encounter Medications as of 11/02/2024  Medication Sig   aspirin  EC 81 MG tablet Take 1 tablet (81 mg total) by mouth daily.   cyanocobalamin  (VITAMIN B12) 1000 MCG tablet Take 1 tablet by mouth daily.   lansoprazole (PREVACID) 15 MG capsule Take 15 mg by mouth daily at 12 noon.   amLODipine  (NORVASC ) 5 MG tablet Take 1 tablet (5 mg total) by mouth daily.   atorvastatin  (LIPITOR) 20 MG tablet Take 1 tablet (20 mg total) by mouth daily.   losartan  (COZAAR ) 100 MG tablet Take 1 tablet (100 mg total) by mouth daily.   [DISCONTINUED] amLODipine  (NORVASC ) 5 MG tablet Take 1 tablet (5 mg total) by mouth daily.   [DISCONTINUED] atorvastatin  (LIPITOR) 20 MG tablet Take 1 tablet (20  mg total) by mouth daily.   [DISCONTINUED] benzonatate  (TESSALON ) 200 MG capsule Take 1 capsule (200 mg total) by mouth 3 (three) times daily as needed.   [DISCONTINUED] losartan  (COZAAR ) 100 MG tablet TAKE 1 TABLET(100 MG) BY MOUTH DAILY   [DISCONTINUED] promethazine -dextromethorphan (PROMETHAZINE -DM) 6.25-15 MG/5ML syrup Take  5 mLs by mouth 4 (four) times daily as needed.   No facility-administered encounter medications on file as of 11/02/2024.     Lab Results  Component Value Date   WBC 6.0 02/22/2024   HGB 15.3 02/22/2024   HCT 45.0 02/22/2024   PLT 255.0 02/22/2024   GLUCOSE 107 (H) 10/31/2024   CHOL 135 10/31/2024   TRIG 201.0 (H) 10/31/2024   HDL 33.80 (L) 10/31/2024   LDLDIRECT 95.0 02/28/2020   LDLCALC 61 10/31/2024   ALT 25 10/31/2024   AST 19 10/31/2024   NA 138 10/31/2024   K 4.4 10/31/2024   CL 104 10/31/2024   CREATININE 0.99 10/31/2024   BUN 14 10/31/2024   CO2 30 10/31/2024   TSH 0.64 02/22/2024   PSA 0.38 08/25/2023   HGBA1C 5.7 10/31/2024       Assessment & Plan:  Gastroesophageal reflux disease, unspecified whether esophagitis present Assessment & Plan: No upper symptoms reported. Continue prevacid.    Hypercholesterolemia Assessment & Plan: Continue lipitor.  Low cholesterol diet and exercise.  Follow lipid panel.  Lab Results  Component Value Date   CHOL 135 10/31/2024   HDL 33.80 (L) 10/31/2024   LDLCALC 61 10/31/2024   LDLDIRECT 95.0 02/28/2020   TRIG 201.0 (H) 10/31/2024   CHOLHDL 4 10/31/2024     Orders: -     Hepatic function panel; Future -     Lipid panel; Future -     CBC with Differential/Platelet; Future  Hyperglycemia Assessment & Plan: Low carb diet and exercise discussed. A1c 6.1. Follow met b and A1c.   Orders: -     Hemoglobin A1c; Future  Essential hypertension, benign Assessment & Plan: Blood pressure as outlined. Continue losartan  and amlodipine . Follow pressures. Follow metabolic panel.   Orders: -     Basic  metabolic panel with GFR; Future  Blurred vision Assessment & Plan: Felt related to blood pressure medication. Has adjusted. Better. Discussed. No vision loss. No neurological deficits. Follow.    Anxiety Assessment & Plan: Stable. On no medication.    Other orders -     amLODIPine  Besylate; Take 1 tablet (5 mg total) by mouth daily.  Dispense: 90 tablet; Refill: 1 -     Atorvastatin  Calcium ; Take 1 tablet (20 mg total) by mouth daily.  Dispense: 90 tablet; Refill: 1 -     Losartan  Potassium; Take 1 tablet (100 mg total) by mouth daily.  Dispense: 90 tablet; Refill: 1     Allena Hamilton, MD "

## 2024-11-10 ENCOUNTER — Encounter: Payer: Self-pay | Admitting: Internal Medicine

## 2024-11-10 NOTE — Assessment & Plan Note (Signed)
 Felt related to blood pressure medication. Has adjusted. Better. Discussed. No vision loss. No neurological deficits. Follow.

## 2024-11-10 NOTE — Assessment & Plan Note (Signed)
Blood pressure as outlined.  Continue losartan and amlodipine. Follow pressures.  Follow metabolic panel.

## 2024-11-10 NOTE — Assessment & Plan Note (Signed)
 Continue lipitor.  Low cholesterol diet and exercise.  Follow lipid panel.  Lab Results  Component Value Date   CHOL 135 10/31/2024   HDL 33.80 (L) 10/31/2024   LDLCALC 61 10/31/2024   LDLDIRECT 95.0 02/28/2020   TRIG 201.0 (H) 10/31/2024   CHOLHDL 4 10/31/2024

## 2024-11-10 NOTE — Assessment & Plan Note (Signed)
 Low carb diet and exercise discussed. A1c 6.1. Follow met b and A1c.

## 2024-11-10 NOTE — Assessment & Plan Note (Signed)
No upper symptoms reported.  Continue prevacid.  

## 2024-11-10 NOTE — Assessment & Plan Note (Signed)
Stable  On no medication.

## 2025-03-06 ENCOUNTER — Other Ambulatory Visit

## 2025-03-08 ENCOUNTER — Encounter: Admitting: Internal Medicine
